# Patient Record
Sex: Female | Born: 1990
Health system: Southern US, Community
[De-identification: ages and names within clinical notes are randomized; demographics above are authoritative.]

## PROBLEM LIST (undated history)

## (undated) DIAGNOSIS — Z789 Other specified health status: Secondary | ICD-10-CM

## (undated) HISTORY — PX: NO PAST SURGERIES: SHX2092

---

## 2015-02-22 NOTE — L&D Delivery Note (Addendum)
Delivery Note Progressed to complete dilation.   Pushed for only a short while to crowning.   At 3:47 AM a viable and healthy female was delivered via Vaginal, Spontaneous Delivery (Presentation: OA ).  APGAR: , ; weight  .   Placenta status: spontaneous and grossly intact with 3VC with the following complications: none  Anesthesia:  Fentanyl, then Local with repair Episiotomy:  none Lacerations:  Shallow 2nd degree perineal Suture Repair: 3.0 monocryl Est. Blood Loss (mL):    Mom to postpartum.  Baby to Couplet care / Skin to Skin.  [facility] IncWILLIAMS,Erica Dinapoli 01/05/2016, 4:17 AM

## 2015-05-14 ENCOUNTER — Encounter (HOSPITAL_COMMUNITY): Payer: Self-pay | Admitting: Emergency Medicine

## 2015-05-14 ENCOUNTER — Emergency Department (HOSPITAL_COMMUNITY): Payer: BLUE CROSS/BLUE SHIELD

## 2015-05-14 DIAGNOSIS — R062 Wheezing: Secondary | ICD-10-CM | POA: Diagnosis not present

## 2015-05-14 DIAGNOSIS — R000 Tachycardia, unspecified: Secondary | ICD-10-CM | POA: Insufficient documentation

## 2015-05-14 DIAGNOSIS — R05 Cough: Secondary | ICD-10-CM | POA: Diagnosis not present

## 2015-05-14 DIAGNOSIS — R0989 Other specified symptoms and signs involving the circulatory and respiratory systems: Secondary | ICD-10-CM | POA: Insufficient documentation

## 2015-05-14 DIAGNOSIS — R0789 Other chest pain: Secondary | ICD-10-CM | POA: Diagnosis not present

## 2015-05-14 LAB — CBC WITH DIFFERENTIAL/PLATELET
BASOS ABS: 0 10*3/uL (ref 0.0–0.1)
Basophils Relative: 0 %
EOS PCT: 2 %
Eosinophils Absolute: 0.3 10*3/uL (ref 0.0–0.7)
HEMATOCRIT: 39.6 % (ref 36.0–46.0)
Hemoglobin: 12.7 g/dL (ref 12.0–15.0)
LYMPHS PCT: 8 %
Lymphs Abs: 1.3 10*3/uL (ref 0.7–4.0)
MCH: 28.7 pg (ref 26.0–34.0)
MCHC: 32.1 g/dL (ref 30.0–36.0)
MCV: 89.6 fL (ref 78.0–100.0)
MONO ABS: 1.3 10*3/uL — AB (ref 0.1–1.0)
MONOS PCT: 8 %
NEUTROS ABS: 13.2 10*3/uL — AB (ref 1.7–7.7)
Neutrophils Relative %: 82 %
PLATELETS: 285 10*3/uL (ref 150–400)
RBC: 4.42 MIL/uL (ref 3.87–5.11)
RDW: 13.1 % (ref 11.5–15.5)
WBC: 16 10*3/uL — ABNORMAL HIGH (ref 4.0–10.5)

## 2015-05-14 LAB — BASIC METABOLIC PANEL
ANION GAP: 11 (ref 5–15)
CALCIUM: 9.3 mg/dL (ref 8.9–10.3)
CO2: 20 mmol/L — AB (ref 22–32)
CREATININE: 0.82 mg/dL (ref 0.44–1.00)
Chloride: 106 mmol/L (ref 101–111)
GFR calc Af Amer: >60 mL/min (ref >60)
GLUCOSE: 89 mg/dL (ref 65–99)
Potassium: 3.5 mmol/L (ref 3.5–5.1)
Sodium: 137 mmol/L (ref 135–145)

## 2015-05-14 MED ORDER — ALBUTEROL SULFATE (2.5 MG/3ML) 0.083% IN NEBU
5.0000 mg | INHALATION_SOLUTION | Freq: Once | RESPIRATORY_TRACT | Status: AC
  Administered 2015-05-14: 5 mg via RESPIRATORY_TRACT

## 2015-05-14 MED ORDER — ALBUTEROL SULFATE (2.5 MG/3ML) 0.083% IN NEBU
INHALATION_SOLUTION | RESPIRATORY_TRACT | Status: AC
  Filled 2015-05-14: qty 6

## 2015-05-14 NOTE — ED Notes (Signed)
Pt. reports worsening SOB with wheezing , chest congestion/tightness , runny nose and dry cough onset today , tachycardic at triage HR= 136 .

## 2015-05-14 NOTE — ED Notes (Signed)
EDP ( Dr. Ranae PalmsYelverton ) , charge nurse Alvy Beal( E. Brown ) , nurse first ( E. Fields ) notified on pt.'s elevated heart rate , no verbal order received from EDP , will move to a room as soon as possible .

## 2015-05-15 ENCOUNTER — Emergency Department (HOSPITAL_COMMUNITY)
Admission: EM | Admit: 2015-05-15 | Discharge: 2015-05-15 | Disposition: A | Discharge status: Home / Self Care | Payer: BLUE CROSS/BLUE SHIELD | Attending: Emergency Medicine | Admitting: Emergency Medicine

## 2015-05-28 ENCOUNTER — Encounter: Payer: Self-pay | Admitting: Certified Nurse Midwife

## 2015-05-28 ENCOUNTER — Ambulatory Visit (INDEPENDENT_AMBULATORY_CARE_PROVIDER_SITE_OTHER): Payer: BLUE CROSS/BLUE SHIELD | Admitting: Certified Nurse Midwife

## 2015-05-28 VITALS — BP 127/75 | Temp 99.7°F | Wt 150.0 lb

## 2015-05-28 DIAGNOSIS — Z3401 Encounter for supervision of normal first pregnancy, first trimester: Secondary | ICD-10-CM | POA: Diagnosis not present

## 2015-05-28 DIAGNOSIS — Z3490 Encounter for supervision of normal pregnancy, unspecified, unspecified trimester: Secondary | ICD-10-CM | POA: Insufficient documentation

## 2015-05-28 LAB — POCT URINALYSIS DIPSTICK
Bilirubin, UA: NEGATIVE
Blood, UA: NEGATIVE
GLUCOSE UA: NEGATIVE
Nitrite, UA: NEGATIVE
SPEC GRAV UA: 1.01
UROBILINOGEN UA: NEGATIVE
pH, UA: 8

## 2015-05-28 MED ORDER — CITRANATAL HARMONY 29-1-265 MG PO CAPS: 1.0000 | ORAL_CAPSULE | Freq: Every day | ORAL | Status: DC

## 2015-05-28 NOTE — Progress Notes (Signed)
Subjective:    Erica Pham is being seen today for her first obstetrical visit.  This is not a planned pregnancy. She is at [redacted]w[redacted]d gestation. Her obstetrical history is significant for none. Relationship with FOB: significant other, living together. Patient does intend to breast feed. Pregnancy history fully reviewed.  The information documented in the HPI was reviewed and verified.  Menstrual History: OB History    Gravida Para Term Preterm AB TAB SAB Ectopic Multiple Living   1               Menarche age: 25 years of age  Patient's last menstrual period was 04/06/2015.    History reviewed. No pertinent past medical history.  History reviewed. No pertinent past surgical history.   (Not in a hospital admission) Allergies  Allergen Reactions  . Prednisone Shortness Of Breath    Social History  Substance Use Topics  . Smoking status: Never Smoker   . Smokeless tobacco: Not on file  . Alcohol Use: No     Comment: not currently    Family History  Problem Relation Age of Onset  . Diabetes Maternal Grandfather      Review of Systems Constitutional: negative for weight loss Gastrointestinal: negative for vomiting, + nausea Genitourinary:negative for genital lesions and vaginal discharge and dysuria Musculoskeletal:negative for back pain Behavioral/Psych: negative for abusive relationship, depression, illegal drug usage and tobacco use    Objective:    BP 127/75 mmHg  Temp(Src) 99.7 F (37.6 C)  Wt 150 lb (68.04 kg)  LMP 04/06/2015 General Appearance:    Alert, cooperative, no distress, appears stated age  Head:    Normocephalic, without obvious abnormality, atraumatic  Eyes:    PERRL, conjunctiva/corneas clear, EOM's intact, fundi    benign, both eyes  Ears:    Normal TM's and external ear canals, both ears  Nose:   Nares normal, septum midline, mucosa normal, no drainage    or sinus tenderness  Throat:   Lips, mucosa, and tongue normal; teeth and gums normal   Neck:   Supple, symmetrical, trachea midline, no adenopathy;    thyroid:  no enlargement/tenderness/nodules; no carotid   bruit or JVD  Back:     Symmetric, no curvature, ROM normal, no CVA tenderness  Lungs:     Clear to auscultation bilaterally, respirations unlabored  Chest Wall:    No tenderness or deformity   Heart:    Regular rate and rhythm, S1 and S2 normal, no murmur, rub   or gallop  Breast Exam:    No tenderness, masses, or nipple abnormality  Abdomen:     Soft, non-tender, bowel sounds active all four quadrants,    no masses, no organomegaly  Genitalia:    Normal female without lesion, discharge or tenderness  Extremities:   Extremities normal, atraumatic, no cyanosis or edema  Pulses:   2+ and symmetric all extremities  Skin:   Skin color, texture, turgor normal, no rashes or lesions  Lymph nodes:   Cervical, supraclavicular, and axillary nodes normal  Neurologic:   CNII-XII intact, normal strength, sensation and reflexes    throughout          Cervix:  Long, thick, closed and posterior2    Lab Review Urine pregnancy test Labs reviewed yes Radiologic studies reviewed no Assessment:    Pregnancy at [redacted]w[redacted]d weeks   Nausea in early pregnancy  Plan:     Prenatal vitamins samples given, rx written if too expensive consider OTC PNV.  Counseling provided  regarding continued use of seat belts, cessation of alcohol consumption, smoking or use of illicit drugs; infection precautions i.e., influenza/TDAP immunizations, toxoplasmosis,CMV, parvovirus, listeria and varicella; workplace safety, exercise during pregnancy; routine dental care, safe medications, sexual activity, hot tubs, saunas, pools, travel, caffeine use, fish and methlymercury, potential toxins, hair treatments, varicose veins Weight gain recommendations per IOM guidelines reviewed: underweight/BMI< 18.5--> gain 28 - 40 lbs; normal weight/BMI 18.5 - 24.9--> gain 25 - 35 lbs; overweight/BMI 25 - 29.9--> gain 15 - 25  lbs; obese/BMI >30->gain  11 - 20 lbs Problem list reviewed and updated. FIRST/CF mutation testing/NIPT/QUAD SCREEN/fragile X/Ashkenazi Jewish population testing/Spinal muscular atrophy discussed: requested. Role of ultrasound in pregnancy discussed; fetal survey: requested. Amniocentesis discussed: not indicated. VBAC calculator score: VBAC consent form provided Meds ordered this encounter  Medications  . prenatal vitamin w/FE, FA (PRENATAL 1 + 1) 27-1 MG TABS tablet    Sig: Take 1 tablet by mouth daily at 12 noon.   Orders Placed This Encounter  Procedures  . Culture, OB Urine  . HIV antibody  . Hemoglobinopathy evaluation  . Varicella zoster antibody, IgG  . Prenatal Profile I  . VITAMIN D 25 Hydroxy (Vit-D Deficiency, Fractures)  . POCT urinalysis dipstick    Follow up in 4 weeks. 50% of 30 min visit spent on counseling and coordination of care.

## 2015-05-30 LAB — URINE CULTURE, OB REFLEX

## 2015-05-30 LAB — CULTURE, OB URINE

## 2015-06-01 ENCOUNTER — Other Ambulatory Visit: Payer: Self-pay | Admitting: Certified Nurse Midwife

## 2015-06-01 DIAGNOSIS — B3731 Acute candidiasis of vulva and vagina: Secondary | ICD-10-CM

## 2015-06-01 DIAGNOSIS — B373 Candidiasis of vulva and vagina: Secondary | ICD-10-CM

## 2015-06-01 LAB — PRENATAL PROFILE I(LABCORP)
Antibody Screen: NEGATIVE
BASOS ABS: 0 10*3/uL (ref 0.0–0.2)
Basos: 0 %
EOS (ABSOLUTE): 0 10*3/uL (ref 0.0–0.4)
Eos: 0 %
HEMOGLOBIN: 13 g/dL (ref 11.1–15.9)
Hematocrit: 40.4 % (ref 34.0–46.6)
Hepatitis B Surface Ag: NEGATIVE
Immature Grans (Abs): 0 10*3/uL (ref 0.0–0.1)
Immature Granulocytes: 0 %
LYMPHS: 18 %
Lymphocytes Absolute: 1.4 10*3/uL (ref 0.7–3.1)
MCH: 29.3 pg (ref 26.6–33.0)
MCHC: 32.2 g/dL (ref 31.5–35.7)
MCV: 91 fL (ref 79–97)
MONOCYTES: 9 %
Monocytes Absolute: 0.7 10*3/uL (ref 0.1–0.9)
NEUTROS ABS: 5.6 10*3/uL (ref 1.4–7.0)
Neutrophils: 73 %
PLATELETS: 334 10*3/uL (ref 150–379)
RBC: 4.44 x10E6/uL (ref 3.77–5.28)
RDW: 13.5 % (ref 12.3–15.4)
RPR Ser Ql: NONREACTIVE
Rh Factor: POSITIVE
Rubella Antibodies, IGG: 2.25 index (ref >0.99)
WBC: 7.8 10*3/uL (ref 3.4–10.8)

## 2015-06-01 LAB — PAP IG W/ RFLX HPV ASCU: PAP SMEAR COMMENT: 0

## 2015-06-01 LAB — NUSWAB VAGINITIS PLUS (VG+)
CANDIDA ALBICANS, NAA: POSITIVE — AB
CHLAMYDIA TRACHOMATIS, NAA: NEGATIVE
Candida glabrata, NAA: NEGATIVE
Neisseria gonorrhoeae, NAA: NEGATIVE
Trich vag by NAA: NEGATIVE

## 2015-06-01 LAB — HEMOGLOBINOPATHY EVALUATION
HGB C: 0 %
HGB S: 0 %
Hemoglobin A2 Quantitation: 2.5 % (ref 0.7–3.1)
Hemoglobin F Quantitation: 0 % (ref 0.0–2.0)
Hgb A: 97.5 % (ref 94.0–98.0)

## 2015-06-01 LAB — VITAMIN D 25 HYDROXY (VIT D DEFICIENCY, FRACTURES): VIT D 25 HYDROXY: 11.3 ng/mL — AB (ref 30.0–100.0)

## 2015-06-01 LAB — HIV ANTIBODY (ROUTINE TESTING W REFLEX): HIV Screen 4th Generation wRfx: NONREACTIVE

## 2015-06-01 LAB — VARICELLA ZOSTER ANTIBODY, IGG: Varicella zoster IgG: 2403 index (ref >165)

## 2015-06-01 MED ORDER — TERCONAZOLE 0.4 % VA CREA: 1.0000 | TOPICAL_CREAM | Freq: Every day | VAGINAL | Status: DC

## 2015-06-02 ENCOUNTER — Encounter: Payer: Self-pay | Admitting: *Deleted

## 2015-06-25 ENCOUNTER — Ambulatory Visit (INDEPENDENT_AMBULATORY_CARE_PROVIDER_SITE_OTHER): Payer: BLUE CROSS/BLUE SHIELD | Admitting: Certified Nurse Midwife

## 2015-06-25 ENCOUNTER — Other Ambulatory Visit: Payer: Self-pay | Admitting: Certified Nurse Midwife

## 2015-06-25 VITALS — BP 127/71 | HR 105 | Wt 154.0 lb

## 2015-06-25 DIAGNOSIS — Z3401 Encounter for supervision of normal first pregnancy, first trimester: Secondary | ICD-10-CM

## 2015-06-25 LAB — POCT URINALYSIS DIPSTICK
BILIRUBIN UA: NEGATIVE
Blood, UA: NEGATIVE
GLUCOSE UA: NEGATIVE
KETONES UA: NEGATIVE
Leukocytes, UA: NEGATIVE
Nitrite, UA: NEGATIVE
Protein, UA: NEGATIVE
SPEC GRAV UA: 1.01
UROBILINOGEN UA: NEGATIVE
pH, UA: 7

## 2015-06-25 NOTE — Progress Notes (Signed)
  Subjective:    Erica Pham is a 25 y.o. female being seen today for her obstetrical visit. She is at 10131w3d gestation. Patient reports: no complaints.  Problem List Items Addressed This Visit    None     Patient Active Problem List   Diagnosis Date Noted  . Supervision of normal first pregnancy in first trimester 05/28/2015    Objective:     BP 127/71 mmHg  Pulse 105  Wt 154 lb (69.854 kg)  LMP 04/06/2015 Uterine Size: Below umbilicus   FHR: 155  Assessment:    Pregnancy @ 6531w3d  weeks Doing well    Plan:    Problem list reviewed and updated. Labs reviewed.  Follow up in 4 weeks. FIRST/CF mutation testing/NIPT/QUAD SCREEN/fragile X/Ashkenazi Jewish population testing/Spinal muscular atrophy discussed: ordered. Role of ultrasound in pregnancy discussed; fetal survey: requested. Amniocentesis discussed: not indicated. 50% of 15 minute visit spent on counseling and coordination of care.

## 2015-06-25 NOTE — Addendum Note (Signed)
Addended by: Luella CookMCINTYRE, DIRECE E on: 06/25/2015 04:31 PM   Modules accepted: Orders

## 2015-06-25 NOTE — Progress Notes (Signed)
Patient has no questions or concerns today.

## 2015-07-08 LAB — MATERNIT21 PLUS CORE+SCA
CHROMOSOME 13: NEGATIVE
CHROMOSOME 18: NEGATIVE
CHROMOSOME 21: NEGATIVE
PDF: 0
Y CHROMOSOME: DETECTED

## 2015-07-09 ENCOUNTER — Encounter: Payer: Self-pay | Admitting: Certified Nurse Midwife

## 2015-07-09 NOTE — Telephone Encounter (Signed)
Spoke with pt regarding results for Materni21. Pt made aware results are negative.  Pt request copy to be printed for her to pick up.  Pt would like to know gender when partner is available. Pt made aware copy will be left at front desk for pick up.

## 2015-07-14 ENCOUNTER — Other Ambulatory Visit: Payer: Self-pay | Admitting: Certified Nurse Midwife

## 2015-07-23 ENCOUNTER — Ambulatory Visit (INDEPENDENT_AMBULATORY_CARE_PROVIDER_SITE_OTHER): Payer: BLUE CROSS/BLUE SHIELD | Admitting: Certified Nurse Midwife

## 2015-07-23 VITALS — BP 138/81 | HR 109 | Wt 157.0 lb

## 2015-07-23 DIAGNOSIS — Z3402 Encounter for supervision of normal first pregnancy, second trimester: Secondary | ICD-10-CM

## 2015-07-23 LAB — POCT URINALYSIS DIPSTICK
Bilirubin, UA: NEGATIVE
Glucose, UA: NEGATIVE
KETONES UA: NEGATIVE
LEUKOCYTES UA: NEGATIVE
NITRITE UA: NEGATIVE
PH UA: 6
RBC UA: NEGATIVE
Spec Grav, UA: 1.025
Urobilinogen, UA: NEGATIVE

## 2015-07-23 NOTE — Progress Notes (Signed)
  Subjective:    Erica Pham is a 25 y.o. female being seen today for her obstetrical visit. She is at 882w3d gestation. Patient reports: no complaints.  Problem List Items Addressed This Visit    None    Visit Diagnoses    Encounter for supervision of normal first pregnancy in second trimester    -  Primary    Relevant Orders    POCT urinalysis dipstick (Completed)    US OB Comp + 14 Wk      Patient Active Problem List   Diagnosis Date Noted  . Supervision of normal first pregnancy in first trimester 05/28/2015    Objective:     BP 138/81 mmHg  Pulse 109  Wt 157 lb (71.215 kg)  LMP 04/06/2015 Uterine Size: Below umbilicus   FHR: 145 by doppler  Assessment:    Pregnancy @ 4182w3d  weeks Doing well    Plan:    Problem list reviewed and updated. Labs reviewed.  Follow up in 4 weeks. FIRST/CF mutation testing/NIPT/QUAD SCREEN/fragile X/Ashkenazi Jewish population testing/Spinal muscular atrophy discussed: results reviewed. Role of ultrasound in pregnancy discussed; fetal survey: ordered. Amniocentesis discussed: not indicated. 50% of 15 minute visit spent on counseling and coordination of care.

## 2015-08-20 ENCOUNTER — Ambulatory Visit (INDEPENDENT_AMBULATORY_CARE_PROVIDER_SITE_OTHER): Payer: BLUE CROSS/BLUE SHIELD

## 2015-08-20 ENCOUNTER — Ambulatory Visit (INDEPENDENT_AMBULATORY_CARE_PROVIDER_SITE_OTHER): Payer: BLUE CROSS/BLUE SHIELD | Admitting: Certified Nurse Midwife

## 2015-08-20 ENCOUNTER — Encounter: Payer: Self-pay | Admitting: *Deleted

## 2015-08-20 VITALS — BP 127/78 | HR 95 | Wt 162.0 lb

## 2015-08-20 DIAGNOSIS — Z36 Encounter for antenatal screening of mother: Secondary | ICD-10-CM

## 2015-08-20 DIAGNOSIS — Z3492 Encounter for supervision of normal pregnancy, unspecified, second trimester: Secondary | ICD-10-CM

## 2015-08-20 DIAGNOSIS — Z3402 Encounter for supervision of normal first pregnancy, second trimester: Secondary | ICD-10-CM

## 2015-08-20 LAB — POCT URINALYSIS DIPSTICK
Bilirubin, UA: NEGATIVE
Glucose, UA: NEGATIVE
Ketones, UA: NEGATIVE
NITRITE UA: NEGATIVE
PH UA: 7
RBC UA: NEGATIVE
Spec Grav, UA: 1.01
UROBILINOGEN UA: NEGATIVE

## 2015-08-20 NOTE — Progress Notes (Signed)
Subjective:    Erica Pham is a 25 y.o. female being seen today for her obstetrical visit. She is at 5659w3d gestation. Patient reports: no complaints . Fetal movement: normal.  Problem List Items Addressed This Visit    None    Visit Diagnoses    Prenatal care, second trimester    -  Primary    Relevant Orders    POCT urinalysis dipstick (Completed)      Patient Active Problem List   Diagnosis Date Noted  . Supervision of normal first pregnancy in first trimester 05/28/2015   Objective:    BP 127/78 mmHg  Pulse 95  Wt 162 lb (73.483 kg)  LMP 04/06/2015 FHT: 150 BPM  Uterine Size: size equals dates     Assessment:    Pregnancy @ 4859w3d    Plan:    OBGCT: discussed. Signs and symptoms of preterm labor: discussed. Waterbirth certificate recieved  Labs, problem list reviewed and updated 2 hr GTT planned Follow up in 4 weeks.

## 2015-08-22 ENCOUNTER — Encounter: Payer: Self-pay | Admitting: Certified Nurse Midwife

## 2015-09-04 ENCOUNTER — Other Ambulatory Visit: Payer: Self-pay | Admitting: Certified Nurse Midwife

## 2015-09-18 ENCOUNTER — Ambulatory Visit (INDEPENDENT_AMBULATORY_CARE_PROVIDER_SITE_OTHER): Payer: BLUE CROSS/BLUE SHIELD | Admitting: Certified Nurse Midwife

## 2015-09-18 VITALS — BP 117/73 | HR 96 | Temp 98.8°F | Wt 173.0 lb

## 2015-09-18 DIAGNOSIS — J301 Allergic rhinitis due to pollen: Secondary | ICD-10-CM

## 2015-09-18 DIAGNOSIS — Z3402 Encounter for supervision of normal first pregnancy, second trimester: Secondary | ICD-10-CM

## 2015-09-18 LAB — POCT URINALYSIS DIPSTICK
Bilirubin, UA: NEGATIVE
Blood, UA: NEGATIVE
Glucose, UA: NEGATIVE
Ketones, UA: NEGATIVE
NITRITE UA: NEGATIVE
PROTEIN UA: NEGATIVE
UROBILINOGEN UA: NEGATIVE
pH, UA: 7

## 2015-09-18 MED ORDER — FLUTICASONE PROPIONATE 50 MCG/ACT NA SUSP: 2.0000 | Freq: Every day | NASAL | 2 refills | Status: DC

## 2015-09-18 MED ORDER — LORATADINE 10 MG PO TABS: 10.0000 mg | ORAL_TABLET | Freq: Every day | ORAL | 99 refills | Status: DC

## 2015-09-18 NOTE — Progress Notes (Signed)
Pt is having cold/allergy symptoms. Please review for advise on medication.

## 2015-09-18 NOTE — Progress Notes (Signed)
Subjective:    Erica Pham is a 25 y.o. female being seen today for her obstetrical visit. She is at 109w4d gestation. Patient reports: no bleeding, no contractions, no cramping, no leaking and sinus pressure, congestion with hx of allergies; has not been taking anything for them . Denies any fever, chills or sputum production/cough. Fetal movement: normal.  Problem List Items Addressed This Visit    None    Visit Diagnoses    Encounter for supervision of normal first pregnancy in second trimester    -  Primary   Relevant Orders   POCT urinalysis dipstick (Completed)   Allergic rhinitis due to pollen       Relevant Medications   loratadine (CLARITIN) 10 MG tablet   fluticasone (FLONASE) 50 MCG/ACT nasal spray     Patient Active Problem List   Diagnosis Date Noted  . Supervision of normal first pregnancy in first trimester 05/28/2015   Objective:    BP 117/73   Pulse 96   Temp 98.8 F (37.1 C)   Wt 173 lb (78.5 kg)   LMP 04/06/2015   BMI 29.70 kg/m  FHT: 135 BPM  Uterine Size: size equals dates   + maxillary sinus pressure/tenderness to palpation.    Assessment:    Pregnancy @ [redacted]w[redacted]d    Allergic rhinitis  Plan:    Safe OTC medications discussed   OBGCT: discussed and ordered for next visit. Signs and symptoms of preterm labor: discussed.  Labs, problem list reviewed and updated 2 hr GTT planned Follow up in 4 weeks.

## 2015-10-16 ENCOUNTER — Other Ambulatory Visit: Payer: BLUE CROSS/BLUE SHIELD

## 2015-10-16 ENCOUNTER — Ambulatory Visit (INDEPENDENT_AMBULATORY_CARE_PROVIDER_SITE_OTHER): Payer: BLUE CROSS/BLUE SHIELD | Admitting: Certified Nurse Midwife

## 2015-10-16 VITALS — BP 106/66 | HR 95 | Temp 98.5°F | Wt 181.9 lb

## 2015-10-16 DIAGNOSIS — Z3401 Encounter for supervision of normal first pregnancy, first trimester: Secondary | ICD-10-CM

## 2015-10-16 DIAGNOSIS — Z3402 Encounter for supervision of normal first pregnancy, second trimester: Secondary | ICD-10-CM

## 2015-10-16 LAB — POCT URINALYSIS DIPSTICK
Bilirubin, UA: NEGATIVE
Glucose, UA: NEGATIVE
KETONES UA: NEGATIVE
Nitrite, UA: NEGATIVE
PH UA: 8
PROTEIN UA: NEGATIVE
RBC UA: NEGATIVE
SPEC GRAV UA: 1.015
Urobilinogen, UA: 0.2

## 2015-10-16 NOTE — Progress Notes (Signed)
Patient denies concerns at this time.

## 2015-10-16 NOTE — Progress Notes (Signed)
Subjective:    Erica Pham is a 25 y.o. female being seen today for her obstetrical visit. She is at 1763w4d gestation. Patient reports: no complaints . Fetal movement: normal.  Problem List Items Addressed This Visit      Other   Supervision of normal first pregnancy in first trimester    Other Visit Diagnoses    Encounter for supervision of normal first pregnancy in second trimester    -  Primary   Relevant Orders   POCT Urinalysis Dipstick (Completed)     Patient Active Problem List   Diagnosis Date Noted  . Supervision of normal first pregnancy in first trimester 05/28/2015   Objective:    BP 106/66   Pulse 95   Temp 98.5 F (36.9 C)   Wt 181 lb 14.4 oz (82.5 kg)   LMP 04/06/2015   BMI 31.22 kg/m  FHT: 140 BPM  Uterine Size: 27 cm and size equals dates     Assessment:    Pregnancy @ 9763w4d    Planning waterbirth   Plan:    OBGCT: discussed and ordered. Signs and symptoms of preterm labor: discussed.    Labs, problem list reviewed and updated 2 hr GTT today Follow up in 2 weeks.

## 2015-10-16 NOTE — Addendum Note (Signed)
Addended by: Marya LandryFOSTER, Vonya Ohalloran D on: 10/16/2015 11:32 AM   Modules accepted: Orders

## 2015-10-17 LAB — CBC
Hematocrit: 36.2 % (ref 34.0–46.6)
Hemoglobin: 12 g/dL (ref 11.1–15.9)
MCH: 30.7 pg (ref 26.6–33.0)
MCHC: 33.1 g/dL (ref 31.5–35.7)
MCV: 93 fL (ref 79–97)
PLATELETS: 212 10*3/uL (ref 150–379)
RBC: 3.91 x10E6/uL (ref 3.77–5.28)
RDW: 14.1 % (ref 12.3–15.4)
WBC: 10.5 10*3/uL (ref 3.4–10.8)

## 2015-10-17 LAB — GLUCOSE TOLERANCE, 2 HOURS W/ 1HR
GLUCOSE, FASTING: 77 mg/dL (ref 65–91)
Glucose, 1 hour: 100 mg/dL (ref 65–179)
Glucose, 2 hour: 94 mg/dL (ref 65–152)

## 2015-10-17 LAB — RPR: RPR Ser Ql: NONREACTIVE

## 2015-10-17 LAB — HIV ANTIBODY (ROUTINE TESTING W REFLEX): HIV Screen 4th Generation wRfx: NONREACTIVE

## 2015-10-18 ENCOUNTER — Other Ambulatory Visit: Payer: Self-pay | Admitting: Certified Nurse Midwife

## 2015-10-18 DIAGNOSIS — Z3401 Encounter for supervision of normal first pregnancy, first trimester: Secondary | ICD-10-CM

## 2015-10-21 ENCOUNTER — Telehealth: Payer: Self-pay | Admitting: *Deleted

## 2015-10-21 NOTE — Telephone Encounter (Signed)
Patient made aware of 2 hour GTT result.

## 2015-10-30 ENCOUNTER — Encounter: Payer: Self-pay | Admitting: Certified Nurse Midwife

## 2015-10-30 ENCOUNTER — Ambulatory Visit (INDEPENDENT_AMBULATORY_CARE_PROVIDER_SITE_OTHER): Payer: BLUE CROSS/BLUE SHIELD | Admitting: Certified Nurse Midwife

## 2015-10-30 VITALS — BP 126/75 | HR 96 | Temp 99.1°F | Wt 184.4 lb

## 2015-10-30 DIAGNOSIS — Z3403 Encounter for supervision of normal first pregnancy, third trimester: Secondary | ICD-10-CM

## 2015-10-30 NOTE — Progress Notes (Signed)
Subjective:    Erica Pham is a 25 y.o. female being seen today for her obstetrical visit. She is at 8734w4d gestation. Patient reports no complaints. Fetal movement: normal.  Problem List Items Addressed This Visit    None    Visit Diagnoses    Supervision of normal first pregnancy in third trimester    -  Primary     Patient Active Problem List   Diagnosis Date Noted  . Supervision of normal first pregnancy in first trimester 05/28/2015   Objective:    BP 126/75   Pulse 96   Temp 99.1 F (37.3 C)   Wt 184 lb 6.4 oz (83.6 kg)   LMP 04/06/2015   BMI 31.65 kg/m  FHT:  130 BPM  Uterine Size: 31 cm and size equals dates  Presentation: cephalic     Assessment:    Pregnancy @ 1634w4d weeks   Doing well  Waterbirth planned  Plan:     labs reviewed, problem list updated Consent signed. GBS planning TDAP offered  Rhogam given for RH negative Pediatrician: discussed. Infant feeding: plans to breastfeed. Maternity leave: discussed. Cigarette smoking: never smoked. No orders of the defined types were placed in this encounter.  No orders of the defined types were placed in this encounter.  Follow up in 2 Weeks.

## 2015-10-30 NOTE — Progress Notes (Signed)
Pt denies concerns at this time. 

## 2015-11-10 ENCOUNTER — Telehealth: Payer: Self-pay | Admitting: *Deleted

## 2015-11-10 NOTE — Telephone Encounter (Signed)
Patient is having sinus pressure for over 1 week and she needs to talk to someone. LM on VM to CB

## 2015-11-17 NOTE — Telephone Encounter (Signed)
No response- re file 

## 2015-11-18 ENCOUNTER — Ambulatory Visit (INDEPENDENT_AMBULATORY_CARE_PROVIDER_SITE_OTHER): Payer: BLUE CROSS/BLUE SHIELD | Admitting: Obstetrics and Gynecology

## 2015-11-18 VITALS — BP 109/71 | HR 98 | Temp 99.3°F | Wt 190.4 lb

## 2015-11-18 DIAGNOSIS — Z3401 Encounter for supervision of normal first pregnancy, first trimester: Secondary | ICD-10-CM

## 2015-11-18 DIAGNOSIS — Z3493 Encounter for supervision of normal pregnancy, unspecified, third trimester: Secondary | ICD-10-CM | POA: Diagnosis not present

## 2015-11-18 MED ORDER — TETANUS-DIPHTH-ACELL PERTUSSIS 5-2.5-18.5 LF-MCG/0.5 IM SUSP
0.5000 mL | Freq: Once | INTRAMUSCULAR | Status: AC
  Administered 2015-11-18: 0.5 mL via INTRAMUSCULAR

## 2015-11-18 NOTE — Progress Notes (Signed)
   PRENATAL VISIT NOTE  Subjective:  Erica Pham is a 25 y.o. G1P0 at 6951w2d being seen today for ongoing prenatal care.  She is currently monitored for the following issues for this low-risk pregnancy and has Supervision of normal first pregnancy in first trimester on her problem list.  Patient reports no complaints.  Contractions: Not present. Vag. Bleeding: None.  Movement: Present. Denies leaking of fluid.   The following portions of the patient's history were reviewed and updated as appropriate: allergies, current medications, past family history, past medical history, past social history, past surgical history and problem list. Problem list updated.  Objective:   Vitals:   11/18/15 0810  BP: 109/71  Pulse: 98  Temp: 99.3 F (37.4 C)  Weight: 190 lb 6.4 oz (86.4 kg)    Fetal Status: Fetal Heart Rate (bpm): 139 Fundal Height: 32 cm Movement: Present     General:  Alert, oriented and cooperative. Patient is in no acute distress.  Skin: Skin is warm and dry. No rash noted.   Cardiovascular: Normal heart rate noted  Respiratory: Normal respiratory effort, no problems with respiration noted  Abdomen: Soft, gravid, appropriate for gestational age. Pain/Pressure: Absent     Pelvic:  Cervical exam deferred        Extremities: Normal range of motion.  Edema: None  Mental Status: Normal mood and affect. Normal behavior. Normal judgment and thought content.   Urinalysis: Urine Protein: Negative Urine Glucose: Negative  Assessment and Plan:  Pregnancy: G1P0 at 7751w2d  1. Supervision of normal first pregnancy in first trimester Patient is doing well without complaints Still planning water birth and has her supplies Declined flu vaccine Desires TDap vaccine - Tdap (BOOSTRIX) injection 0.5 mL; Inject 0.5 mLs into the muscle once.  Preterm labor symptoms and general obstetric precautions including but not limited to vaginal bleeding, contractions, leaking of fluid and fetal  movement were reviewed in detail with the patient. Please refer to After Visit Summary for other counseling recommendations.  No Follow-up on file.  Catalina AntiguaPeggy Jolly Carlini, MD

## 2015-11-18 NOTE — Progress Notes (Signed)
Patient is in the office for visit, pt states that she is overall feeling well. Administered Tdap and patient tolerated

## 2015-11-24 ENCOUNTER — Ambulatory Visit (INDEPENDENT_AMBULATORY_CARE_PROVIDER_SITE_OTHER): Payer: BLUE CROSS/BLUE SHIELD | Admitting: Obstetrics

## 2015-11-24 VITALS — BP 138/78 | HR 108 | Temp 97.9°F | Wt 195.3 lb

## 2015-11-24 DIAGNOSIS — J0111 Acute recurrent frontal sinusitis: Secondary | ICD-10-CM

## 2015-11-24 DIAGNOSIS — Z3403 Encounter for supervision of normal first pregnancy, third trimester: Secondary | ICD-10-CM

## 2015-11-24 MED ORDER — AZITHROMYCIN 250 MG PO TABS: ORAL_TABLET | ORAL | 0 refills | Status: DC

## 2015-11-30 ENCOUNTER — Encounter: Payer: Self-pay | Admitting: Obstetrics

## 2015-11-30 NOTE — Progress Notes (Signed)
Subjective:    Erica Pham is a 25 y.o. female being seen today for her obstetrical visit. She is at 7615w0d gestation. Patient reports no complaints. Fetal movement: normal.  Problem List Items Addressed This Visit    None    Visit Diagnoses    Supervision of normal first pregnancy in third trimester    -  Primary   Acute recurrent frontal sinusitis       Relevant Medications   azithromycin (ZITHROMAX) 250 MG tablet     Patient Active Problem List   Diagnosis Date Noted  . Supervision of normal first pregnancy in first trimester 05/28/2015   Objective:    BP 138/78   Pulse (!) 108   Temp 97.9 F (36.6 C)   Wt 195 lb 4.8 oz (88.6 kg)   LMP 04/06/2015   BMI 33.52 kg/m  FHT:  140 BPM  Uterine Size: size equals dates  Presentation: unsure     Assessment:    Pregnancy @ 7015w0d weeks   Plan:     labs reviewed, problem list updated Consent signed. GBS sent TDAP offered  Rhogam given for RH negative Pediatrician: discussed. Infant feeding: plans to breastfeed. Maternity leave: discussed. Cigarette smoking: never smoked. No orders of the defined types were placed in this encounter.  Meds ordered this encounter  Medications  . azithromycin (ZITHROMAX) 250 MG tablet    Sig: Take 2 tabs po load, then take 1 tab po daily.    Dispense:  15 tablet    Refill:  0   Follow up in 1 Week.

## 2015-12-02 ENCOUNTER — Encounter: Payer: BLUE CROSS/BLUE SHIELD | Admitting: Obstetrics and Gynecology

## 2015-12-08 ENCOUNTER — Ambulatory Visit (INDEPENDENT_AMBULATORY_CARE_PROVIDER_SITE_OTHER): Payer: BLUE CROSS/BLUE SHIELD | Admitting: Certified Nurse Midwife

## 2015-12-08 VITALS — BP 111/73 | HR 101 | Wt 195.0 lb

## 2015-12-08 DIAGNOSIS — N760 Acute vaginitis: Secondary | ICD-10-CM

## 2015-12-08 DIAGNOSIS — Z3403 Encounter for supervision of normal first pregnancy, third trimester: Secondary | ICD-10-CM

## 2015-12-08 DIAGNOSIS — Z113 Encounter for screening for infections with a predominantly sexual mode of transmission: Secondary | ICD-10-CM | POA: Diagnosis not present

## 2015-12-08 LAB — OB RESULTS CONSOLE GBS: GBS: NEGATIVE

## 2015-12-08 LAB — OB RESULTS CONSOLE GC/CHLAMYDIA: GC PROBE AMP, GENITAL: NEGATIVE

## 2015-12-08 NOTE — Progress Notes (Signed)
Subjective:    Erica Pham is a 25 y.o. female being seen today for her obstetrical visit. She is at 152w1d gestation. Patient reports no complaints. Fetal movement: normal.  Problem List Items Addressed This Visit    None    Visit Diagnoses    Supervision of normal first pregnancy in third trimester    -  Primary   Relevant Orders   Culture, beta strep (group b only)   Cervicovaginal ancillary only     Patient Active Problem List   Diagnosis Date Noted  . Supervision of normal first pregnancy in first trimester 05/28/2015   Objective:    BP 111/73   Pulse (!) 101   Wt 195 lb (88.5 kg)   LMP 04/06/2015   BMI 33.47 kg/m  FHT:  132 BPM  Uterine Size: 35 cm and size equals dates  Presentation: cephalic   Cervix:  1cm, long, ballotable, posterior, medium-hard.   Assessment:    Pregnancy @ 2152w1d weeks   Waterbirth planned  Plan:     labs reviewed, problem list updated Consent signed. GBS sent TDAP offered  Rhogam given for RH negative Pediatrician: discussed. Infant feeding: plans to breastfeed. Maternity leave: discussed. Cigarette smoking: never smoked. Orders Placed This Encounter  Procedures  . Culture, beta strep (group b only)   No orders of the defined types were placed in this encounter.  Follow up in 1 Week.

## 2015-12-09 LAB — CERVICOVAGINAL ANCILLARY ONLY
Chlamydia: POSITIVE — AB
Neisseria Gonorrhea: NEGATIVE
Trichomonas: NEGATIVE

## 2015-12-11 LAB — CERVICOVAGINAL ANCILLARY ONLY
BACTERIAL VAGINITIS: POSITIVE — AB
CANDIDA VAGINITIS: NEGATIVE

## 2015-12-12 LAB — CULTURE, BETA STREP (GROUP B ONLY): Strep Gp B Culture: NEGATIVE

## 2015-12-15 ENCOUNTER — Other Ambulatory Visit: Payer: Self-pay | Admitting: Certified Nurse Midwife

## 2015-12-15 ENCOUNTER — Ambulatory Visit (INDEPENDENT_AMBULATORY_CARE_PROVIDER_SITE_OTHER): Payer: BLUE CROSS/BLUE SHIELD | Admitting: Obstetrics & Gynecology

## 2015-12-15 ENCOUNTER — Telehealth: Payer: Self-pay

## 2015-12-15 DIAGNOSIS — A749 Chlamydial infection, unspecified: Secondary | ICD-10-CM | POA: Diagnosis not present

## 2015-12-15 DIAGNOSIS — O98813 Other maternal infectious and parasitic diseases complicating pregnancy, third trimester: Secondary | ICD-10-CM

## 2015-12-15 DIAGNOSIS — Z3493 Encounter for supervision of normal pregnancy, unspecified, third trimester: Secondary | ICD-10-CM

## 2015-12-15 DIAGNOSIS — O98313 Other infections with a predominantly sexual mode of transmission complicating pregnancy, third trimester: Secondary | ICD-10-CM | POA: Diagnosis not present

## 2015-12-15 DIAGNOSIS — O99820 Streptococcus B carrier state complicating pregnancy: Secondary | ICD-10-CM | POA: Insufficient documentation

## 2015-12-15 MED ORDER — AZITHROMYCIN 500 MG PO TABS: 1000.0000 mg | ORAL_TABLET | Freq: Once | ORAL | 1 refills | Status: AC

## 2015-12-15 NOTE — Telephone Encounter (Signed)
Patient in office today for visit and made aware of test results and told about rx. Patient seen Dr. Debroah LoopArnold today.

## 2015-12-15 NOTE — Patient Instructions (Signed)
Chlamydia, Female Chlamydia is an infection. It is spread through sexual contact. Chlamydia can be in different areas of the body. These areas include the cervix, urethra, throat, or rectum. You may not know you have chlamydia because many people never develop the symptoms. Chlamydia is not difficult to treat once you know you have it. However, if it is left untreated, chlamydia can lead to more serious health problems.  CAUSES  Chlamydia is caused by bacteria. It is a sexually transmitted disease. It is passed from an infected partner during intimate contact. This contact could be with the genitals, mouth, or rectal area. Chlamydia can also be passed from mothers to babies during birth. SIGNS AND SYMPTOMS  There may not be any symptoms. This is often the case early in the infection. If symptoms develop, they may include:  Mild pain and discomfort when urinating.  Redness, soreness, and swelling (inflammation) of the rectum.  Vaginal discharge.  Painful intercourse.  Abdominal pain.  Bleeding between menstrual periods. DIAGNOSIS  To diagnose this infection, your health care provider will do a pelvic exam. Cultures will be taken of the vagina, cervix, urine, and possibly the rectum to verify the diagnosis.  TREATMENT You will be given antibiotic medicines. If you are pregnant, certain types of antibiotics will need to be avoided. Any sexual partners should also be treated, even if they do not show symptoms. Your health care provider may test you for infection again 3 months after treatment. HOME CARE INSTRUCTIONS   Take your antibiotic medicine as directed by your health care provider. Finish the antibiotic even if you start to feel better.  Take medicines only as directed by your health care provider.  Inform any sexual partners about the infection. They should also be treated.  Do not have sexual contact until your health care provider tells you it is okay.  Get plenty of  rest.  Eat a well-balanced diet.  Drink enough fluids to keep your urine clear or pale yellow.  Keep all follow-up visits as directed by your health care provider. SEEK MEDICAL CARE IF:  You have painful urination.  You have abdominal pain.  You have vaginal discharge.  You have painful sexual intercourse.  You have bleeding between periods and after sex.  You have a fever. SEEK IMMEDIATE MEDICAL CARE IF:   You experience nausea or vomiting.  You experience excessive sweating (diaphoresis).  You have difficulty swallowing.   This information is not intended to replace advice given to you by your health care provider. Make sure you discuss any questions you have with your health care provider.   Document Released: 11/17/2004 Document Revised: 10/29/2014 Document Reviewed: 10/15/2012 Elsevier Interactive Patient Education 2016 [employer]  

## 2015-12-15 NOTE — Progress Notes (Signed)
   PRENATAL VISIT NOTE  Subjective:  Erica Pham is a 25 y.o. G1P0 at 5016w1d being seen today for ongoing prenatal care.  She is currently monitored for the following issues for this low-risk pregnancy and has Supervision of low-risk pregnancy on her problem list.  Patient reports no complaints.  Contractions: Not present. Vag. Bleeding: None.  Movement: Present. Denies leaking of fluid.   The following portions of the patient's history were reviewed and updated as appropriate: allergies, current medications, past family history, past medical history, past social history, past surgical history and problem list. Problem list updated.  Objective:   Vitals:   12/15/15 0826  BP: 124/78  Pulse: 98  Temp: 97.6 F (36.4 C)  Weight: 198 lb 6.4 oz (90 kg)    Fetal Status: Fetal Heart Rate (bpm): 139   Movement: Present     General:  Alert, oriented and cooperative. Patient is in no acute distress.  Skin: Skin is warm and dry. No rash noted.   Cardiovascular: Normal heart rate noted  Respiratory: Normal respiratory effort, no problems with respiration noted  Abdomen: Soft, gravid, appropriate for gestational age. Pain/Pressure: Absent     Pelvic:  Cervical exam deferred        Extremities: Normal range of motion.  Edema: None  Mental Status: Normal mood and affect. Normal behavior. Normal judgment and thought content.   Assessment and Plan:  Pregnancy: G1P0 at 7016w1d  1. Encounter for supervision of low-risk pregnancy in third trimester CT needs tx, notify partner  Preterm labor symptoms and general obstetric precautions including but not limited to vaginal bleeding, contractions, leaking of fluid and fetal movement were reviewed in detail with the patient. Please refer to After Visit Summary for other counseling recommendations.  Return in about 1 week (around 12/22/2015).  Adam PhenixJames G Arnold, MD

## 2015-12-15 NOTE — Progress Notes (Signed)
Patient stated that she is feeling good other than being tired, reports good fetal movement.

## 2015-12-22 ENCOUNTER — Ambulatory Visit (INDEPENDENT_AMBULATORY_CARE_PROVIDER_SITE_OTHER): Payer: BLUE CROSS/BLUE SHIELD | Admitting: Obstetrics and Gynecology

## 2015-12-22 VITALS — BP 110/74 | HR 102

## 2015-12-22 DIAGNOSIS — O98813 Other maternal infectious and parasitic diseases complicating pregnancy, third trimester: Principal | ICD-10-CM

## 2015-12-22 DIAGNOSIS — O98313 Other infections with a predominantly sexual mode of transmission complicating pregnancy, third trimester: Secondary | ICD-10-CM

## 2015-12-22 DIAGNOSIS — A749 Chlamydial infection, unspecified: Secondary | ICD-10-CM | POA: Diagnosis not present

## 2015-12-22 DIAGNOSIS — Z3490 Encounter for supervision of normal pregnancy, unspecified, unspecified trimester: Secondary | ICD-10-CM

## 2015-12-22 NOTE — Progress Notes (Signed)
   PRENATAL VISIT NOTE  Subjective:  Erica Pham is a 25 y.o. G1P0 at 6073w1d being seen today for ongoing prenatal care.  She is currently monitored for the following issues for this low-risk pregnancy and has Supervision of low-risk pregnancy and Chlamydia infection affecting pregnancy, antepartum, third trimester on her problem list.  Patient reports no complaints.  Contractions: Irregular. Vag. Bleeding: None.  Movement: Present. Denies leaking of fluid.   The following portions of the patient's history were reviewed and updated as appropriate: allergies, current medications, past family history, past medical history, past social history, past surgical history and problem list. Problem list updated.  Objective:   Vitals:   12/22/15 1000  BP: 110/74  Pulse: (!) 102    Fetal Status: Fetal Heart Rate (bpm): 139 Fundal Height: 37 cm Movement: Present     General:  Alert, oriented and cooperative. Patient is in no acute distress.  Skin: Skin is warm and dry. No rash noted.   Cardiovascular: Normal heart rate noted  Respiratory: Normal respiratory effort, no problems with respiration noted  Abdomen: Soft, gravid, appropriate for gestational age. Pain/Pressure: Present     Pelvic:  Cervical exam deferred        Extremities: Normal range of motion.  Edema: None  Mental Status: Normal mood and affect. Normal behavior. Normal judgment and thought content.   Assessment and Plan:  Pregnancy: G1P0 at 1173w1d  1. Encounter for supervision of low-risk pregnancy, antepartum Patient is doing well Answered questions regarding recent diagnosis of chlamydia. Patient received treatment on 10/24 Pediatricians will be [facility]  Term labor symptoms and general obstetric precautions including but not limited to vaginal bleeding, contractions, leaking of fluid and fetal movement were reviewed in detail with the patient. Please refer to After Visit  Summary for other counseling recommendations.  No Follow-up on file.  Catalina AntiguaPeggy Glyn Zendejas, MD

## 2015-12-29 ENCOUNTER — Encounter: Payer: Self-pay | Admitting: *Deleted

## 2015-12-29 ENCOUNTER — Encounter: Payer: BLUE CROSS/BLUE SHIELD | Admitting: Obstetrics and Gynecology

## 2015-12-29 ENCOUNTER — Ambulatory Visit (INDEPENDENT_AMBULATORY_CARE_PROVIDER_SITE_OTHER): Payer: BLUE CROSS/BLUE SHIELD | Admitting: Certified Nurse Midwife

## 2015-12-29 VITALS — BP 138/44 | HR 108 | Wt 206.0 lb

## 2015-12-29 DIAGNOSIS — Z3493 Encounter for supervision of normal pregnancy, unspecified, third trimester: Secondary | ICD-10-CM | POA: Diagnosis not present

## 2015-12-29 NOTE — Patient Instructions (Addendum)
Vaginal Delivery °During delivery, your health care provider will help you give birth to your baby. During a vaginal delivery, you will work to push the baby out of your vagina. However, before you can push your baby out, a few things need to happen. The opening of your uterus (cervix) has to soften, thin out, and open up (dilate) all the way to 10 cm. Also, your baby has to move down from the uterus into your vagina.  °SIGNS OF LABOR  °Your health care provider will first need to make sure you are in labor. Signs of labor include:  °· Passing what is called the mucous plug before labor begins. This is a small amount of blood-stained mucus. °· Having regular, painful uterine contractions.   °· The time between contractions gets shorter.   °· The discomfort and pain gradually get more intense. °· Contraction pains get worse when walking and do not go away when resting.   °· Your cervix becomes thinner (effacement) and dilates. °BEFORE THE DELIVERY °Once you are in labor and admitted into the hospital or care center, your health care provider may do the following:  °· Perform a complete physical exam. °· Review any complications related to pregnancy or labor.  °· Check your blood pressure, pulse, temperature, and heart rate (vital signs).   °· Determine if, and when, the rupture of amniotic membranes occurred. °· Do a vaginal exam (using a sterile glove and lubricant) to determine:   °¨ The position (presentation) of the baby. Is the baby's head presenting first (vertex) in the birth canal (vagina), or are the feet or buttocks first (breech)?   °¨ The level (station) of the baby's head within the birth canal.   °¨ The effacement and dilatation of the cervix.   °· An electronic fetal monitor is usually placed on your abdomen when you first arrive. This is used to monitor your contractions and the baby's heart rate. °¨ When the monitor is on your abdomen (external fetal monitor), it can only pick up the frequency and  length of your contractions. It cannot tell the strength of your contractions. °¨ If it becomes necessary for your health care provider to know exactly how strong your contractions are or to see exactly what the baby's heart rate is doing, an internal monitor may be inserted into your vagina and uterus. Your health care provider will discuss the benefits and risks of using an internal monitor and obtain your permission before inserting the device. °¨ Continuous fetal monitoring may be needed if you have an epidural, are receiving certain medicines (such as oxytocin), or have pregnancy or labor complications. °· An IV access tube may be placed into a vein in your arm to deliver fluids and medicines if necessary. °THREE STAGES OF LABOR AND DELIVERY °Normal labor and delivery is divided into three stages. °First Stage °This stage starts when you begin to contract regularly and your cervix begins to efface and dilate. It ends when your cervix is completely open (fully dilated). The first stage is the longest stage of labor and can last from 3 hours to 15 hours.  °Several methods are available to help with labor pain. You and your health care provider will decide which option is best for you. Options include:  °· Opioid medicines. These are strong pain medicines that you can get through your IV tube or as a shot into your muscle. These medicines lessen pain but do not make it go away completely.  °· Epidural. A medicine is given through a thin tube that   is inserted in your back. The medicine numbs the lower part of your body and prevents any pain in that area. °· Paracervical pain medicine. This is an injection of an anesthetic on each side of your cervix.   °· You may request natural childbirth, which does not involve the use of pain medicines or an epidural during labor and delivery. Instead, you will use other things, such as breathing exercises, to help cope with the pain. °Second Stage °The second stage of labor  begins when your cervix is fully dilated at 10 cm. It continues until you push your baby down through the birth canal and the baby is born. This stage can take only minutes or several hours. °· The location of your baby's head as it moves through the birth canal is reported as a number called a station. If the baby's head has not started its descent, the station is described as being at minus 3 (-3). When your baby's head is at the zero station, it is at the middle of the birth canal and is engaged in the pelvis. The station of your baby helps indicate the progress of the second stage of labor. °· When your baby is born, your health care provider may hold the baby with his or her head lowered to prevent amniotic fluid, mucus, and blood from getting into the baby's lungs. The baby's mouth and nose may be suctioned with a small bulb syringe to remove any additional fluid. °· Your health care provider may then place the baby on your stomach. It is important to keep the baby from getting cold. To do this, the health care provider will dry the baby off, place the baby directly on your skin (with no blankets between you and the baby), and cover the baby with warm, dry blankets.   °· The umbilical cord is cut. °Third Stage °During the third stage of labor, your health care provider will deliver the placenta (afterbirth) and make sure your bleeding is under control. The delivery of the placenta usually takes about 5 minutes but can take up to 30 minutes. After the placenta is delivered, a medicine may be given either by IV or injection to help contract the uterus and control bleeding. If you are planning to breastfeed, you can try to do so now. °After you deliver the placenta, your uterus should contract and get very firm. If your uterus does not remain firm, your health care provider will massage it. This is important because the contraction of the uterus helps cut off bleeding at the site where the placenta was attached  to your uterus. If your uterus does not contract properly and stay firm, you may continue to bleed heavily. If there is a lot of bleeding, medicines may be given to contract the uterus and stop the bleeding.  °  °This information is not intended to replace advice given to you by your health care provider. Make sure you discuss any questions you have with your health care provider. °  °Document Released: 11/17/2007 Document Revised: 02/28/2014 Document Reviewed: 10/05/2011 °Elsevier Interactive Patient Education ©2016 [employer] ° °

## 2015-12-29 NOTE — Progress Notes (Signed)
Subjective:    Erica Pham is a 25 y.o. female being seen today for her obstetrical visit. She is at 2859w1d gestation. Patient reports no bleeding, no leaking and occasional contractions. Fetal movement: normal.  Problem List Items Addressed This Visit      Other   Supervision of low-risk pregnancy - Primary     Patient Active Problem List   Diagnosis Date Noted  . Chlamydia infection affecting pregnancy, antepartum, third trimester 12/15/2015  . Supervision of low-risk pregnancy 05/28/2015    Objective:    BP (!) 138/44   Pulse (!) 108   Wt 206 lb (93.4 kg)   LMP 04/06/2015   BMI 35.36 kg/m  FHT: 136 BPM  Uterine Size: 39 cm and size equals dates  Presentations: cephalic  Pelvic Exam:              Dilation: 1cm       Effacement: Long             Station:  -3    Consistency: medium            Position: middle     Assessment:    Pregnancy @ 859w1d weeks   Waterbirth planned  Plan:   Plans for delivery: Vaginal anticipated; labs reviewed; problem list updated Counseling: Consent signed for waterbirth. Infant feeding: plans to breastfeed. Cigarette smoking: never smoked. L&D discussion: symptoms of labor, discussed when to call, discussed what number to call, anesthetic/analgesic options reviewed and delivering clinician:  plans Certified Nurse-Midwife. Postpartum supports and preparation: circumcision discussed and contraception plans discussed.  Follow up in 1 Week.

## 2016-01-04 ENCOUNTER — Encounter (HOSPITAL_COMMUNITY): Payer: Self-pay | Admitting: *Deleted

## 2016-01-04 ENCOUNTER — Inpatient Hospital Stay (HOSPITAL_COMMUNITY)
Admission: AD | Admit: 2016-01-04 | Discharge: 2016-01-06 | DRG: 775 | Disposition: A | Discharge status: Home / Self Care | Payer: BLUE CROSS/BLUE SHIELD | Source: Ambulatory Visit | Attending: Family Medicine | Admitting: Family Medicine

## 2016-01-04 DIAGNOSIS — O4290 Premature rupture of membranes, unspecified as to length of time between rupture and onset of labor, unspecified weeks of gestation: Secondary | ICD-10-CM | POA: Diagnosis present

## 2016-01-04 DIAGNOSIS — Z3A39 39 weeks gestation of pregnancy: Secondary | ICD-10-CM

## 2016-01-04 DIAGNOSIS — O4202 Full-term premature rupture of membranes, onset of labor within 24 hours of rupture: Secondary | ICD-10-CM | POA: Diagnosis present

## 2016-01-04 DIAGNOSIS — Z833 Family history of diabetes mellitus: Secondary | ICD-10-CM | POA: Diagnosis not present

## 2016-01-04 HISTORY — DX: Other specified health status: Z78.9

## 2016-01-04 LAB — CBC
HCT: 36.5 % (ref 36.0–46.0)
Hemoglobin: 12.2 g/dL (ref 12.0–15.0)
MCH: 30.3 pg (ref 26.0–34.0)
MCHC: 33.4 g/dL (ref 30.0–36.0)
MCV: 90.8 fL (ref 78.0–100.0)
PLATELETS: 167 10*3/uL (ref 150–400)
RBC: 4.02 MIL/uL (ref 3.87–5.11)
RDW: 14.3 % (ref 11.5–15.5)
WBC: 11 10*3/uL — ABNORMAL HIGH (ref 4.0–10.5)

## 2016-01-04 LAB — TYPE AND SCREEN
ABO/RH(D): A POS
Antibody Screen: NEGATIVE

## 2016-01-04 LAB — ABO/RH: ABO/RH(D): A POS

## 2016-01-04 MED ORDER — ACETAMINOPHEN 325 MG PO TABS: 650.0000 mg | ORAL_TABLET | ORAL | Status: DC | PRN

## 2016-01-04 MED ORDER — TERBUTALINE SULFATE 1 MG/ML IJ SOLN
0.2500 mg | Freq: Once | INTRAMUSCULAR | Status: DC | PRN
  Filled 2016-01-04: qty 1

## 2016-01-04 MED ORDER — OXYCODONE-ACETAMINOPHEN 5-325 MG PO TABS: 2.0000 | ORAL_TABLET | ORAL | Status: DC | PRN

## 2016-01-04 MED ORDER — OXYTOCIN BOLUS FROM INFUSION
500.0000 mL | Freq: Once | INTRAVENOUS | Status: DC
Start: 2016-01-04 — End: 2016-01-05

## 2016-01-04 MED ORDER — OXYCODONE-ACETAMINOPHEN 5-325 MG PO TABS: 1.0000 | ORAL_TABLET | ORAL | Status: DC | PRN

## 2016-01-04 MED ORDER — LIDOCAINE HCL (PF) 1 % IJ SOLN
30.0000 mL | INTRAMUSCULAR | Status: DC | PRN
  Filled 2016-01-04: qty 30

## 2016-01-04 MED ORDER — SOD CITRATE-CITRIC ACID 500-334 MG/5ML PO SOLN: 30.0000 mL | ORAL | Status: DC | PRN

## 2016-01-04 MED ORDER — OXYTOCIN 40 UNITS IN LACTATED RINGERS INFUSION - SIMPLE MED
1.0000 m[IU]/min | INTRAVENOUS | Status: DC
  Administered 2016-01-04: 2 m[IU]/min via INTRAVENOUS
  Filled 2016-01-04 (×2): qty 1000

## 2016-01-04 MED ORDER — LACTATED RINGERS IV SOLN
500.0000 mL | INTRAVENOUS | Status: DC | PRN
  Administered 2016-01-04: 500 mL via INTRAVENOUS

## 2016-01-04 MED ORDER — OXYTOCIN 40 UNITS IN LACTATED RINGERS INFUSION - SIMPLE MED
2.5000 [IU]/h | INTRAVENOUS | Status: DC
Start: 2016-01-04 — End: 2016-01-05

## 2016-01-04 MED ORDER — LACTATED RINGERS IV SOLN
INTRAVENOUS | Status: DC
  Administered 2016-01-05: 01:00:00 via INTRAVENOUS

## 2016-01-04 MED ORDER — ONDANSETRON HCL 4 MG/2ML IJ SOLN: 4.0000 mg | Freq: Four times a day (QID) | INTRAMUSCULAR | Status: DC | PRN

## 2016-01-04 NOTE — MAU Note (Signed)
OK for pt to go to room 171 per Candis SchatzH. Koran RN

## 2016-01-04 NOTE — H&P (Signed)
LABOR AND DELIVERY ADMISSION HISTORY AND PHYSICAL NOTE  Erica Pham is a 25 y.o. female G1P0000 with IUP at 7330w0d by LMP presenting for PROM at term.   She reports positive fetal movement. She states that she started noticing leaking last night at 9 pm; fern test 18 hours later is positive. Denies bleeding or contractions. Is resistant to IOL because she desires a natural water birth. She has had a positive chlamydia test, treated, but with no test to cure.   Prenatal History/Complications: Denied by Pt  Past Medical History:     Past Medical History:  Diagnosis Date  . Medical history non-contributory     Past Surgical History:      Past Surgical History:  Procedure Laterality Date  . NO PAST SURGERIES      Obstetrical History: OB History    Gravida Para Term Preterm AB Living   1 0 0 0 0 0   SAB TAB Ectopic Multiple Live Births   0 0 0 0 0      Social History: Social History        Social History  . Marital status: Single    Spouse name: N/A  . Number of children: N/A  . Years of education: N/A         Social History Main Topics  . Smoking status: Never Smoker  . Smokeless tobacco: Never Used  . Alcohol use No     Comment: not currently  . Drug use: No  . Sexual activity: Yes    Birth control/ protection: Pill       Other Topics Concern  . None      Social History Narrative  . None    Family History:      Family History  Problem Relation Age of Onset  . Diabetes Maternal Grandfather     Allergies:     Allergies  Allergen Reactions  . Prednisone Shortness Of Breath           Prescriptions Prior to Admission  Medication Sig Dispense Refill Last Dose  . Prenat w/o A-FeCbn-DSS-FA-DHA (CITRANATAL HARMONY) 29-1-265 MG CAPS Take 1 capsule by mouth at bedtime.   Past Week at Unknown time     Review of Systems   All systems reviewed and negative except as stated in HPI  Blood pressure  120/93, pulse 108, temperature 98.3 F (36.8 C), temperature source Oral, resp. rate 18, height 5\' 4"  (1.626 m), weight 93.4 kg (206 lb), last menstrual period 04/06/2015, SpO2 96 %. General appearance: alert, cooperative, appears stated age and no distress Lungs: clear to auscultation bilaterally Heart: regular rate and rhythm Abdomen: soft, non-tender; bowel sounds normal Extremities: No calf swelling or tenderness Presentation: cephalic Fetal monitoring: FHR 140 baseline with mod variability, +accels Uterine activity: Normal Dilation: 3 Effacement (%): 70 Station: -2 Exam by:: Fabian NovemberM. Deatra Mcmahen, CNM   Prenatal labs: ABO, Rh: A/Positive/-- (04/06 1052) Antibody: Negative (04/06 1052) Rubella: !Error! Immune RPR: Non Reactive (08/25 1115)  HBsAg: Negative (04/06 1052)  HIV: Non Reactive (08/25 1115)  GBS:   Neg 1 hr Glucola: 3rd trimester GTT 77/100/94 Genetic screening: Declined Anatomy US: Female  Prenatal Transfer Tool  Maternal Diabetes: No Genetic Screening: Declined Maternal Ultrasounds/Referrals: Normal Fetal Ultrasounds or other Referrals:  None Maternal Substance Abuse:  No Significant Maternal Medications:  None Significant Maternal Lab Results: Lab values include: Group B Strep negative  No results found for this or any previous visit (from the past 24 hour(s)).  Patient Active Problem List   Diagnosis Date Noted  . Chlamydia infection affecting pregnancy, antepartum, third trimester 12/15/2015  . Supervision of low-risk pregnancy 05/28/2015    Assessment: [redacted] weeks gestation PROM at Term Cat I FHT GBS neg Admit to L&D   Dicussed with pt and partner that +CT without neg TOC is contraindication for water immersion and waterbirth, she is understanding. Recommend IOL with either Pitocin or Cytotec now d/t prolonged ROM, discussed risk of infection for baby as well as her if we await spontaneous labor. She desires to think about her options. Doula at  bedside.    Donette LarryMelanie Jaben Benegas, CNM  01/04/2016 6:00 PM

## 2016-01-04 NOTE — MAU Note (Signed)
Urine in lab 

## 2016-01-04 NOTE — MAU Note (Signed)
Pt states she started ? Leaking sometime last night, had large gush of clear fluid an hour ago, had wet clothes on arrival to MAU.  Having some back pain, denies bleeding.

## 2016-01-04 NOTE — Progress Notes (Signed)
Patient ID: Erica OsgoodSontara Pham, female   DOB: 05/30/1990, 25 y.o.   MRN: [id number] Doing well, not hurting much yet  Vitals:   01/04/16 1839 01/04/16 1928 01/04/16 2037 01/04/16 2100  BP: 126/68 125/81 (!) 119/98   Pulse: (!) 124 (!) 123 (!) 119   Resp: 18 16 17    Temp:    98.7 F (37.1 C)  TempSrc:    Oral  SpO2:      Weight:      Height:       FHR reactive, Category I  UCs every 3 min  Dilation: 2 Effacement (%): 80 Cervical Position: Middle Station: -3 Presentation: Vertex Exam by:: Wynelle BourgeoisMarie Edgerrin Correia, CNM  Test of cure for chlamydia done  Continue to observe

## 2016-01-04 NOTE — Anesthesia Pain Management Evaluation Note (Signed)
  CRNA Pain Management Visit Note  Patient: Erica Pham, 25 y.o., female  "Hello [facility] am a [facility] of the [facility]. We have an anesthesia team available at all times to provide care throughout the hospital, including epidural management and anesthesia for C-section. I don't know your plan for the delivery whether it a natural birth, water birth, IV sedation, nitrous supplementation, doula or epidural, but we want to meet your pain goals."   1.Was your pain managed to your expectations on prior hospitalizations?   No prior hospitalizations  2.What is your expectation for pain management during this hospitalization?     Water tub  3.How can we help you reach that goal? Be available if needed  Record the patient's initial score and the patient's pain goal.   Pain: 0  Pain Goal: 5 [facility] wants you to be able to say your pain was always managed very well.  O[facility]. [facility] Va Medical CenterMERRITT,Pierrette Scheu 01/04/2016

## 2016-01-04 NOTE — H&P (Signed)
LABOR AND DELIVERY ADMISSION HISTORY AND PHYSICAL NOTE  Erica Pham is a 25 y.o. female G1P0000 with IUP at 7754w0d by LMP presenting for PROM at term.   She reports positive fetal movement. She states that she started noticing leaking last night at 9 pm; fern test 18 hours later is positive. Denies bleeding or contractions. Is resistant to IOL because she desires a natural water birth. She has had a positive chlamydia test, treated, but with no test to cure.   Prenatal History/Complications: Denied by Pt  Past Medical History: Past Medical History:  Diagnosis Date  . Medical history non-contributory     Past Surgical History: Past Surgical History:  Procedure Laterality Date  . NO PAST SURGERIES      Obstetrical History: OB History    Gravida Para Term Preterm AB Living   1 0 0 0 0 0   SAB TAB Ectopic Multiple Live Births   0 0 0 0 0      Social History: Social History   Social History  . Marital status: Single    Spouse name: N/A  . Number of children: N/A  . Years of education: N/A   Social History Main Topics  . Smoking status: Never Smoker  . Smokeless tobacco: Never Used  . Alcohol use No     Comment: not currently  . Drug use: No  . Sexual activity: Yes    Birth control/ protection: Pill   Other Topics Concern  . None   Social History Narrative  . None    Family History: Family History  Problem Relation Age of Onset  . Diabetes Maternal Grandfather     Allergies: Allergies  Allergen Reactions  . Prednisone Shortness Of Breath    Prescriptions Prior to Admission  Medication Sig Dispense Refill Last Dose  . Prenat w/o A-FeCbn-DSS-FA-DHA (CITRANATAL HARMONY) 29-1-265 MG CAPS Take 1 capsule by mouth at bedtime.   Past Week at Unknown time     Review of Systems   All systems reviewed and negative except as stated in HPI  Blood pressure 120/93, pulse 108, temperature 98.3 F (36.8 C), temperature source Oral, resp. rate 18, height 5'  4" (1.626 m), weight 93.4 kg (206 lb), last menstrual period 04/06/2015, SpO2 96 %. General appearance: alert, cooperative, appears stated age and no distress Lungs: clear to auscultation bilaterally Heart: regular rate and rhythm Abdomen: soft, non-tender; bowel sounds normal Extremities: No calf swelling or tenderness Presentation: cephalic Fetal monitoring: FHR 140 baseline with mod variability, +accels Uterine activity: Normal Dilation: 3 Effacement (%): 70 Station: -2 Exam by:: Fabian NovemberM. Bhambri, CNM   Prenatal labs: ABO, Rh: A/Positive/-- (04/06 1052) Antibody: Negative (04/06 1052) Rubella: !Error! Immune RPR: Non Reactive (08/25 1115)  HBsAg: Negative (04/06 1052)  HIV: Non Reactive (08/25 1115)  GBS:   Neg 1 hr Glucola: 3rd trimester GTT 77/100/94 Genetic screening: Declined Anatomy US: Female  Prenatal Transfer Tool  Maternal Diabetes: No Genetic Screening: Declined Maternal Ultrasounds/Referrals: Normal Fetal Ultrasounds or other Referrals:  None Maternal Substance Abuse:  No Significant Maternal Medications:  None Significant Maternal Lab Results: Lab values include: Group B Strep negative  No results found for this or any previous visit (from the past 24 hour(s)).  Patient Active Problem List   Diagnosis Date Noted  . Chlamydia infection affecting pregnancy, antepartum, third trimester 12/15/2015  . Supervision of low-risk pregnancy 05/28/2015    Assessment: Erica Pham is a 25 y.o. G1P0000 at 4554w0d here for PROM.  Admit to L&D for induction of labor. Immersion in water contraindicated due to chlamydia; this was explained to Pt and she was counseled that she can still walk around or get in the shower if she desires.    #Labor: PROM at term; IOL with Pitocin.  #Pain: Plan for natural birth with no pain control; epidural ordered just in case #FWB: Cat 1 #ID:  GBS neg #MOF: Breast #MOC: OCPs #Circ:  Yes, inpt (?)  Torunn Chancellor E Lewis-Bevan 01/04/2016, 3:42  PM

## 2016-01-04 NOTE — Progress Notes (Signed)
Off per CNM, intermittent EFM ordered

## 2016-01-05 ENCOUNTER — Encounter (HOSPITAL_COMMUNITY): Payer: Self-pay

## 2016-01-05 ENCOUNTER — Encounter: Payer: BLUE CROSS/BLUE SHIELD | Admitting: Certified Nurse Midwife

## 2016-01-05 DIAGNOSIS — O4202 Full-term premature rupture of membranes, onset of labor within 24 hours of rupture: Secondary | ICD-10-CM | POA: Diagnosis not present

## 2016-01-05 LAB — RPR: RPR Ser Ql: NONREACTIVE

## 2016-01-05 LAB — GC/CHLAMYDIA PROBE AMP (~~LOC~~) NOT AT ARMC
Chlamydia: NEGATIVE
Neisseria Gonorrhea: NEGATIVE

## 2016-01-05 MED ORDER — PRENATAL MULTIVITAMIN CH
1.0000 | ORAL_TABLET | Freq: Every day | ORAL | Status: DC
  Administered 2016-01-06: 1 via ORAL
  Filled 2016-01-05 (×2): qty 1

## 2016-01-05 MED ORDER — ONDANSETRON HCL 4 MG PO TABS: 4.0000 mg | ORAL_TABLET | ORAL | Status: DC | PRN

## 2016-01-05 MED ORDER — WITCH HAZEL-GLYCERIN EX PADS: 1.0000 "application " | MEDICATED_PAD | CUTANEOUS | Status: DC | PRN

## 2016-01-05 MED ORDER — DIPHENHYDRAMINE HCL 25 MG PO CAPS: 25.0000 mg | ORAL_CAPSULE | Freq: Four times a day (QID) | ORAL | Status: DC | PRN

## 2016-01-05 MED ORDER — IBUPROFEN 600 MG PO TABS
600.0000 mg | ORAL_TABLET | Freq: Four times a day (QID) | ORAL | Status: DC
  Administered 2016-01-05 – 2016-01-06 (×6): 600 mg via ORAL
  Filled 2016-01-05 (×6): qty 1

## 2016-01-05 MED ORDER — DIBUCAINE 1 % RE OINT: 1.0000 "application " | TOPICAL_OINTMENT | RECTAL | Status: DC | PRN

## 2016-01-05 MED ORDER — ACETAMINOPHEN 325 MG PO TABS: 650.0000 mg | ORAL_TABLET | ORAL | Status: DC | PRN

## 2016-01-05 MED ORDER — SENNOSIDES-DOCUSATE SODIUM 8.6-50 MG PO TABS
2.0000 | ORAL_TABLET | ORAL | Status: DC
  Administered 2016-01-05: 2 via ORAL
  Filled 2016-01-05: qty 2

## 2016-01-05 MED ORDER — BENZOCAINE-MENTHOL 20-0.5 % EX AERO
1.0000 "application " | INHALATION_SPRAY | CUTANEOUS | Status: DC | PRN
  Filled 2016-01-05: qty 56

## 2016-01-05 MED ORDER — TETANUS-DIPHTH-ACELL PERTUSSIS 5-2.5-18.5 LF-MCG/0.5 IM SUSP: 0.5000 mL | Freq: Once | INTRAMUSCULAR | Status: DC

## 2016-01-05 MED ORDER — ONDANSETRON HCL 4 MG/2ML IJ SOLN: 4.0000 mg | INTRAMUSCULAR | Status: DC | PRN

## 2016-01-05 MED ORDER — COCONUT OIL OIL: 1.0000 "application " | TOPICAL_OIL | Status: DC | PRN

## 2016-01-05 MED ORDER — FENTANYL CITRATE (PF) 100 MCG/2ML IJ SOLN
100.0000 ug | INTRAMUSCULAR | Status: DC | PRN
  Administered 2016-01-05 (×2): 100 ug via INTRAVENOUS
  Filled 2016-01-05: qty 2

## 2016-01-05 MED ORDER — SIMETHICONE 80 MG PO CHEW: 80.0000 mg | CHEWABLE_TABLET | ORAL | Status: DC | PRN

## 2016-01-05 MED ORDER — FENTANYL CITRATE (PF) 100 MCG/2ML IJ SOLN
INTRAMUSCULAR | Status: AC
  Filled 2016-01-05: qty 2

## 2016-01-05 MED ORDER — ZOLPIDEM TARTRATE 5 MG PO TABS: 5.0000 mg | ORAL_TABLET | Freq: Every evening | ORAL | Status: DC | PRN

## 2016-01-05 NOTE — Progress Notes (Signed)
Patient ID: [name], female   DOB: 10/05/1990, 25 y.o.   MRN: [id number] Doing well  Vitals:   01/04/16 2307 01/04/16 2311 01/05/16 0043 01/05/16 0118  BP:  (!) 121/54 (!) 151/123 (!) 144/112  Pulse:  100 92 83  Resp:  16 16 16   Temp:  99 F (37.2 C)  99.2 F (37.3 C)  TempSrc:  Oral  Oral  SpO2:      Weight: 206 lb (93.4 kg)     Height: 5\' 4"  (1.626 m)      FHR reassuring with good variability but accels do not meet criteria.  There are what appear to be early decelerations which mirror contractions  Dilation: 5 Effacement (%): 90 Cervical Position: Middle Station: 0 Presentation: Vertex Exam by:: Wynelle BourgeoisMarie Williams, CNM Done @ 0100  Will continue to observe

## 2016-01-05 NOTE — Progress Notes (Signed)
Patient ID: Erica OsgoodSontara Pham, female   DOB: 05/23/1990, 25 y.o.   MRN: [id number] Doing well  Vitals:   01/05/16 0043 01/05/16 0118 01/05/16 0214 01/05/16 0248  BP: (!) 151/123 (!) 144/112 134/69 125/60  Pulse: 92 83 (!) 103 (!) 101  Resp: 16 16 16 18   Temp:  99.2 F (37.3 C)  98 F (36.7 C)  TempSrc:  Oral  Oral  SpO2:      Weight:      Height:       FHR stable and reassuring  Dilation: 9 Effacement (%): 100 Cervical Position: Middle Station: +1 Presentation: Vertex Exam by:: Alfonzo BeersMolly Trott, RN   Anticipate second stage soon

## 2016-01-05 NOTE — Lactation Note (Signed)
This note was copied from a baby's chart. Lactation Consultation Note  Patient Name: Erica Germain OsgoodSontara Middendorf ZOXWR'UToday's Date: 01/05/2016 Reason for consult: Initial assessment   Initial assessment with first time mom of 7 hour old infant. Infant STS with mom after bath and in a deep sleep. Mom reports infant fed well after delivery and has not fed since.   Mom reports infant has been tongue sucking and that she was taught to do suck training by RN. Mom reports infant was offered a pacifier because she thought it may help. Enc mom to use suck training vs pacifier as suck is different on a pacifier.   Discussed with mom it is not unusual to have an infant that does not feed often during the first 24 hours. Enc mom to keep infant STS as much as possible to respond to feeding cues. LC phone # left on board for mom to call when infant showing feeding cues. Mom voiced understanding.   LC Brochure and BF Resources Handout given, mom informed of IP/OP Services, BF Support Groups and LC phone #. [facility] and was informed to call for appt after d/c. Advised mom to call out for next feeding.    Maternal Data Formula Feeding for Exclusion: No Has patient been taught Hand Expression?: Yes Does the patient have breastfeeding experience prior to this delivery?: No  Feeding    LATCH Score/Interventions Latch: Too sleepy or reluctant, no latch achieved, no sucking elicited. (Infant tongue sucking, tongue not relaxed, against palate)  Audible Swallowing: None  Type of Nipple: Flat  Comfort (Breast/Nipple): Soft / non-tender     Hold (Positioning): Full assist, staff holds infant at breast  LATCH Score: 3  Lactation Tools Discussed/Used WIC Program: Yes   Consult Status Consult Status: Follow-up Date: 01/05/16 Follow-up type: In-patient    Silas FloodSharon S Hice 01/05/2016, 11:15 AM

## 2016-01-05 NOTE — Progress Notes (Signed)
Post Partum Day #0 Subjective: no complaints, up ad lib, voiding and tolerating PO  Objective: Blood pressure 134/72, pulse (!) 109, temperature 98.3 F (36.8 C), resp. rate 20, height 5\' 4"  (1.626 m), weight 206 lb (93.4 kg), last menstrual period 04/06/2015, SpO2 96 %, unknown if currently breastfeeding.  Physical Exam:  General: alert, cooperative and no distress Lochia: appropriate Uterine Fundus: firm Incision: no significant drainage, no dehiscence, no significant erythema DVT Evaluation: No evidence of DVT seen on physical exam. No cords or calf tenderness. No significant calf/ankle edema.   Recent Labs  01/04/16 1730  HGB 12.2  HCT 36.5    Assessment/Plan: Breastfeeding, Lactation consult and Contraception POP   LOS: 1 day   Roe CoombsRachelle A Dwyne Pham, CNM 01/05/2016, 7:32 AM

## 2016-01-05 NOTE — Lactation Note (Signed)
This note was copied from a baby's chart. Lactation Consultation Note  Patient Name: Erica Pham BJYNW'GToday's Date: 01/05/2016 Reason for consult: Follow-up assessment;Difficult latch;Other (Comment) (Infant sucks tongue and lip.)  Baby 9 hours old. Assisted mom to latch baby in football position to right breast. Baby sleepy at breast initially, so undressed baby and enc mom to nurse STS. Baby latched deeply and suckled rhythmically for a few minutes, but then pushed away from breast and started sucking his tongue and lip. This LC placed a gloved finger in the baby's mouth to prevent baby from lip/tongue sucking, and then allowed baby to suckle finger for a few minutes. Attempted to latch again, and baby latched deeply and suckled rhythmically with lips flanged and mom reported comfort. However, this LC did not note any swallows. Mom was able to hand express some colostrum. Enc mom to offer lots of STS and nurse with cues. Reviewed how to "teacup" breast in order to assist baby with latching. Enc mom to call out for assistance from her nurse with latching as needed.    Maternal Data Formula Feeding for Exclusion: No Has patient been taught Hand Expression?: Yes Does the patient have breastfeeding experience prior to this delivery?: No  Feeding Feeding Type: Breast Fed Length of feed:  (LC assessed first 10 minutes of BF.)  LATCH Score/Interventions Latch: Repeated attempts needed to sustain latch, nipple held in mouth throughout feeding, stimulation needed to elicit sucking reflex. Intervention(s): Adjust position;Assist with latch;Breast compression  Audible Swallowing: None Intervention(s): Skin to skin;Hand expression  Type of Nipple: Everted at rest and after stimulation (short shaft)  Comfort (Breast/Nipple): Soft / non-tender     Hold (Positioning): Assistance needed to correctly position infant at breast and maintain latch. Intervention(s): Breastfeeding basics  reviewed;Support Pillows;Position options;Skin to skin  LATCH Score: 6  Lactation Tools Discussed/Used WIC Program: Yes   Consult Status Consult Status: Follow-up Date: 01/06/16 Follow-up type: In-patient    Erica HayJennifer D Terri Pham 01/05/2016, 1:32 PM

## 2016-01-05 NOTE — Progress Notes (Signed)
Patient and belongings transferred via wheelchair without difficulty to room 108 holding infant and in company of family.

## 2016-01-06 MED ORDER — IBUPROFEN 600 MG PO TABS: 600.0000 mg | ORAL_TABLET | Freq: Four times a day (QID) | ORAL | 2 refills | Status: DC

## 2016-01-06 MED ORDER — ACETAMINOPHEN-CODEINE #3 300-30 MG PO TABS: 1.0000 | ORAL_TABLET | ORAL | 0 refills | Status: DC | PRN

## 2016-01-06 MED ORDER — SENNOSIDES-DOCUSATE SODIUM 8.6-50 MG PO TABS: 2.0000 | ORAL_TABLET | Freq: Two times a day (BID) | ORAL | 2 refills | Status: DC

## 2016-01-06 NOTE — Progress Notes (Signed)
Post Partum Day #1 Subjective: no complaints, up ad lib, voiding and tolerating PO  Objective: Blood pressure (!) 105/59, pulse 84, temperature 97.8 F (36.6 C), temperature source Oral, resp. rate 18, height 5\' 4"  (1.626 m), weight 206 lb (93.4 kg), last menstrual period 04/06/2015, SpO2 100 %, unknown if currently breastfeeding.  Physical Exam:  General: alert, cooperative and no distress Lochia: appropriate Uterine Fundus: firm Incision: no significant drainage, no dehiscence, no significant erythema DVT Evaluation: No evidence of DVT seen on physical exam. No cords or calf tenderness. No significant calf/ankle edema.   Recent Labs  01/04/16 1730  HGB 12.2  HCT 36.5    Assessment/Plan: Discharge home, Breastfeeding and Contraception POP   LOS: 2 days   Roe CoombsRachelle A Emmerson Shuffield, CNM 01/06/2016, 7:21 AM

## 2016-01-06 NOTE — Lactation Note (Signed)
This note was copied from a baby's chart. Lactation Consultation Note  Patient Name: Erica Germain OsgoodSontara Pender UJWJX'BToday's Date: 01/06/2016    Mom was concerned about infant's feeding patterns; parents were educated about initial newborn behavior (and during the 2nd night). During consult, infant was awake & content to be held [facility], but did not show an interest in latching. Mom reports that her breasts are heavier today & with palpation, her breasts do seem as as if they are filling. Mom has no discomfort w/latch. Mom is able to identify signs/sound of swallowing. Specifics of an asymmetric latch shown via [employer] website animation.   Mom knows that she is welcome to call us for a latch check when infant is ready to eat again.   Lurline HareRichey, [facility] 01/06/2016, 9:27 AM

## 2016-01-06 NOTE — Discharge Summary (Signed)
Obstetric Discharge Summary Reason for Admission: rupture of membranes and at 7081w0d Prenatal Procedures: ultrasound Intrapartum Procedures: spontaneous vaginal delivery Postpartum Procedures: none Complications-Operative and Postpartum: 2nd degree perineal laceration Hemoglobin  Date Value Ref Range Status  01/04/2016 12.2 12.0 - 15.0 g/dL Final   HCT  Date Value Ref Range Status  01/04/2016 36.5 36.0 - 46.0 % Final   Hematocrit  Date Value Ref Range Status  10/16/2015 36.2 34.0 - 46.6 % Final    Physical Exam:  General: alert, cooperative and no distress Lochia: appropriate Uterine Fundus: firm Incision: no significant drainage, no dehiscence, no significant erythema DVT Evaluation: No evidence of DVT seen on physical exam. No cords or calf tenderness. No significant calf/ankle edema.  Discharge Diagnoses: Term Pregnancy-delivered  Discharge Information: Date: 01/06/2016 Activity: pelvic rest Diet: routine Medications: PNV, Ibuprofen, Colace and Percocet Condition: stable Instructions: refer to practice specific booklet Discharge to: home Follow-up Information    Erica Pham A Erica Pham, CNM Follow up in 4 week(s).   Specialty:  Certified Nurse Midwife Contact information: [address] [place] [id number] [phone]           Newborn Data: Live born female  Birth Weight: 7 lb 15.3 oz (3610 g) APGAR: 8, 9  Home with mother.  Erica Pham, CNM 01/06/2016, 7:25 AM

## 2016-02-02 ENCOUNTER — Ambulatory Visit (INDEPENDENT_AMBULATORY_CARE_PROVIDER_SITE_OTHER): Payer: BLUE CROSS/BLUE SHIELD | Admitting: Obstetrics

## 2016-02-02 ENCOUNTER — Encounter: Payer: Self-pay | Admitting: Obstetrics

## 2016-02-02 DIAGNOSIS — Z30011 Encounter for initial prescription of contraceptive pills: Secondary | ICD-10-CM

## 2016-02-02 MED ORDER — NORETHINDRONE 0.35 MG PO TABS: 1.0000 | ORAL_TABLET | Freq: Every day | ORAL | 11 refills | Status: DC

## 2016-02-02 NOTE — Progress Notes (Signed)
Subjective:     Erica Pham is a 25 y.o. female who presents for a postpartum visit. She is 4 weeks postpartum following a spontaneous vaginal delivery. I have fully reviewed the prenatal and intrapartum course. The delivery was at 39 gestational weeks. Outcome: spontaneous vaginal delivery. Anesthesia: IV sedation. Postpartum course has been normal. Baby's course has been normal. Baby is feeding by breast. Bleeding thin lochia. Bowel function is normal. Bladder function is normal. Patient is not sexually active. Contraception method is abstinence. Postpartum depression screening: negative.  Tobacco, alcohol and substance abuse history reviewed.  Adult immunizations reviewed including TDAP, rubella and varicella.  The following portions of the patient's history were reviewed and updated as appropriate: allergies, current medications, past family history, past medical history, past social history, past surgical history and problem list.  Review of Systems A comprehensive review of systems was negative.   Objective:    BP 107/70   Pulse 85   Temp 98.3 F (36.8 C) (Oral)   Wt 180 lb (81.6 kg)   Breastfeeding? Yes   BMI 30.90 kg/m    PE:  Deferred    >50% of 10 min visit spent on counseling and coordination of care.    Assessment:    4 weeks postpartum.  Doing well.  Contraceptive Counseling and Advice.  Wants OCP's.  Plan:    1. Contraception: oral progesterone-only contraceptive 2. Micronor Rx 3. Follow up in: 4 weeks or as needed.  2hr GTT for h/o GDM/screening for DM q 3 yrs per ADA recommendations Preconception counseling provided Healthy lifestyle practices reviewed

## 2016-02-11 ENCOUNTER — Telehealth: Payer: Self-pay

## 2016-02-11 NOTE — Telephone Encounter (Signed)
Patient called in and wanted to know if it's ok to use inhaler while breastfeeding, advised patient that it is ok per provider's approval.

## 2016-04-26 ENCOUNTER — Encounter: Payer: Self-pay | Admitting: Obstetrics

## 2016-04-26 ENCOUNTER — Ambulatory Visit (INDEPENDENT_AMBULATORY_CARE_PROVIDER_SITE_OTHER): Payer: BLUE CROSS/BLUE SHIELD | Admitting: Obstetrics

## 2016-04-26 VITALS — BP 116/82 | HR 99 | Ht 64.0 in | Wt 173.3 lb

## 2016-04-26 DIAGNOSIS — Z3009 Encounter for other general counseling and advice on contraception: Secondary | ICD-10-CM

## 2016-04-26 DIAGNOSIS — Z3202 Encounter for pregnancy test, result negative: Secondary | ICD-10-CM

## 2016-04-26 DIAGNOSIS — Z3041 Encounter for surveillance of contraceptive pills: Secondary | ICD-10-CM | POA: Diagnosis not present

## 2016-04-26 LAB — POCT URINE PREGNANCY: PREG TEST UR: NEGATIVE

## 2016-04-26 MED ORDER — NORETHINDRONE 0.35 MG PO TABS: 1.0000 | ORAL_TABLET | Freq: Every day | ORAL | 11 refills | Status: DC

## 2016-04-26 NOTE — Progress Notes (Signed)
Subjective:    Erica Pham is a 26 y.o. female who presents for contraception counseling. The patient has no complaints today. The patient is sexually active. Pertinent past medical history: none.  She was considering switching from OCP's to IUD, but would like to further discuss options.  The information documented in the HPI was reviewed and verified.  Menstrual History: OB History    Gravida Para Term Preterm AB Living   1 1 1  0 0 1   SAB TAB Ectopic Multiple Live Births   0 0 0 0 1       No LMP recorded.   Patient Active Problem List   Diagnosis Date Noted  . PROM (premature rupture of membranes) 01/04/2016  . Chlamydia infection affecting pregnancy, antepartum, third trimester 12/15/2015  . Supervision of low-risk pregnancy 05/28/2015   Past Medical History:  Diagnosis Date  . Medical history non-contributory     Past Surgical History:  Procedure Laterality Date  . NO PAST SURGERIES       Current Outpatient Prescriptions:  .  Prenat w/o A-FeCbn-DSS-FA-DHA (CITRANATAL HARMONY) 29-1-265 MG CAPS, Take 1 capsule by mouth at bedtime., Disp: , Rfl:  .  acetaminophen-codeine (TYLENOL #3) 300-30 MG tablet, Take 1-2 tablets by mouth every 4 (four) hours as needed for moderate pain. (Patient not taking: Reported on 04/26/2016), Disp: 30 tablet, Rfl: 0 .  ibuprofen (ADVIL,MOTRIN) 600 MG tablet, Take 1 tablet (600 mg total) by mouth every 6 (six) hours. (Patient not taking: Reported on 04/26/2016), Disp: 120 tablet, Rfl: 2 .  norethindrone (MICRONOR,CAMILA,ERRIN) 0.35 MG tablet, Take 1 tablet (0.35 mg total) by mouth daily., Disp: 1 Package, Rfl: 11 .  senna-docusate (SENOKOT-S) 8.6-50 MG tablet, Take 2 tablets by mouth 2 (two) times daily. (Patient not taking: Reported on 04/26/2016), Disp: 90 tablet, Rfl: 2 Allergies  Allergen Reactions  . Prednisone Shortness Of Breath    Social History  Substance Use Topics  . Smoking status: Never Smoker  . Smokeless tobacco: Never Used  .  Alcohol use No     Comment: not currently    Family History  Problem Relation Age of Onset  . Diabetes Maternal Grandfather        Review of Systems Constitutional: negative for weight loss Genitourinary:negative for abnormal menstrual periods and vaginal discharge   Objective:   BP 116/82   Pulse 99   Ht 5\' 4"  (1.626 m)   Wt 173 lb 4.8 oz (78.6 kg)   Breastfeeding? Yes   BMI 29.75 kg/m    PE:  Deferred  Lab Review Urine pregnancy test Labs reviewed yes Radiologic studies reviewed no  >50% of 10 min visit spent on counseling and coordination of care.    Assessment:    26 y.o., continuing oral progesterone-only contraceptive ( breast feeding ), no contraindications.   Plan:    All questions answered. Contraception: oral progesterone-only contraceptive. Discussed healthy lifestyle modifications. Follow up in 3 months. Pregnancy test, result: negative.    Meds ordered this encounter  Medications  . norethindrone (MICRONOR,CAMILA,ERRIN) 0.35 MG tablet    Sig: Take 1 tablet (0.35 mg total) by mouth daily.    Dispense:  1 Package    Refill:  11   Orders Placed This Encounter  Procedures  . POCT urine pregnancy

## 2016-04-26 NOTE — Progress Notes (Signed)
Pt presents for IUD Mirena insertion. She is currently taking BCP but prefers IUD. UPT neg.

## 2017-01-11 ENCOUNTER — Telehealth: Payer: Self-pay

## 2017-01-11 ENCOUNTER — Ambulatory Visit: Payer: BLUE CROSS/BLUE SHIELD | Admitting: Obstetrics

## 2017-01-11 ENCOUNTER — Other Ambulatory Visit: Payer: Self-pay | Admitting: Obstetrics

## 2017-01-11 DIAGNOSIS — Z3041 Encounter for surveillance of contraceptive pills: Secondary | ICD-10-CM

## 2017-01-11 MED ORDER — NORETHINDRONE-ETH ESTRADIOL 1-35 MG-MCG PO TABS
1.0000 | ORAL_TABLET | Freq: Every day | ORAL | 11 refills | Status: DC
Start: 2017-01-11 — End: 2017-10-05

## 2017-01-11 NOTE — Telephone Encounter (Signed)
Ortho Novum 1/35 Rx. D/C Micronor.

## 2017-01-11 NOTE — Telephone Encounter (Signed)
Pt states that she would like to have her birth control dosage changed. She states that she is no longer breastfeeding.

## 2017-01-11 NOTE — Progress Notes (Signed)
Pt informed

## 2017-04-01 IMAGING — DX DG CHEST 2V
2 series · 2 of 2 positions shown · non-contrast
Comparison: None.

CLINICAL DATA: Cough, shortness of breath, and weakness for 1 week.
Diagnosed with upper respiratory tract infection yesterday at
primary physician. Symptoms worsening today.

EXAM:
CHEST  2 VIEW

[chest pa]
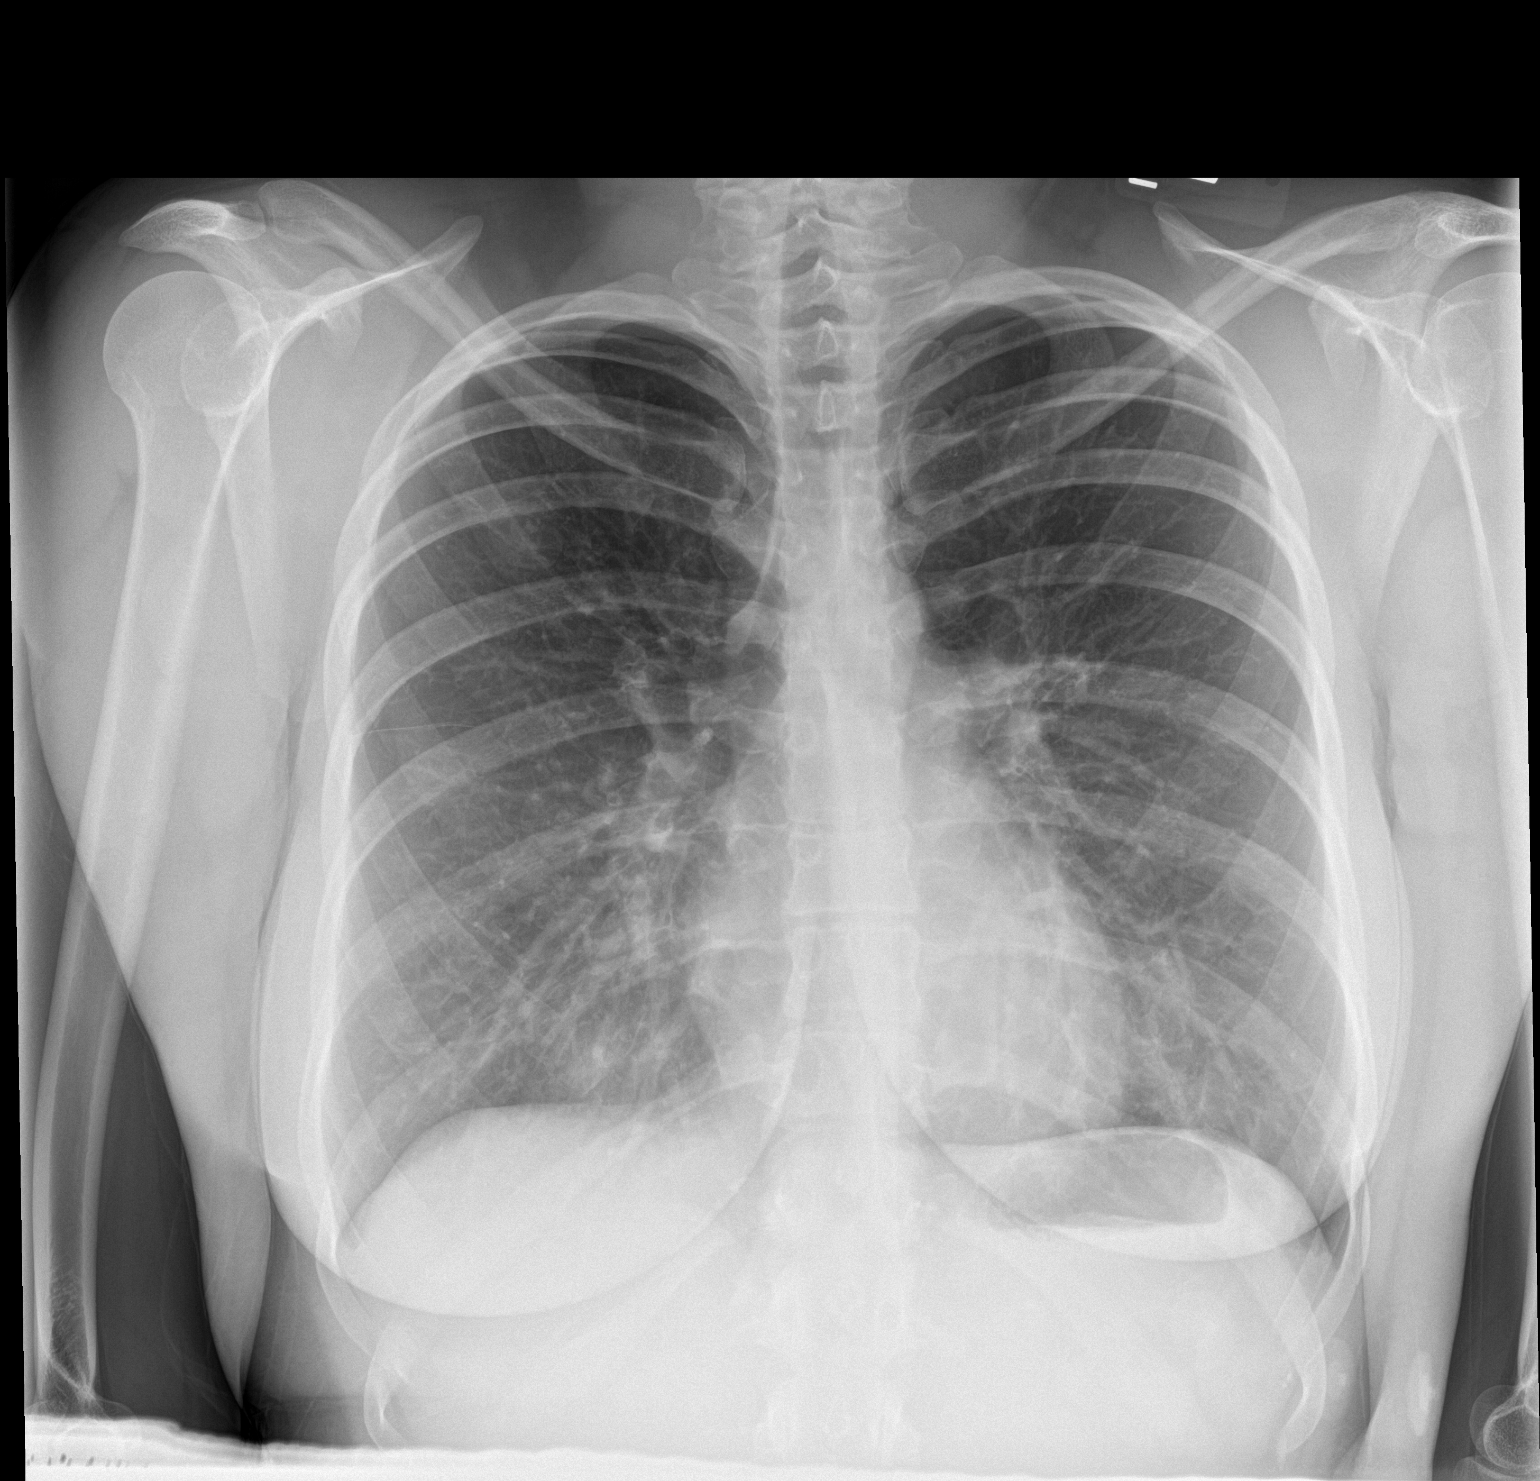

[chest lat]
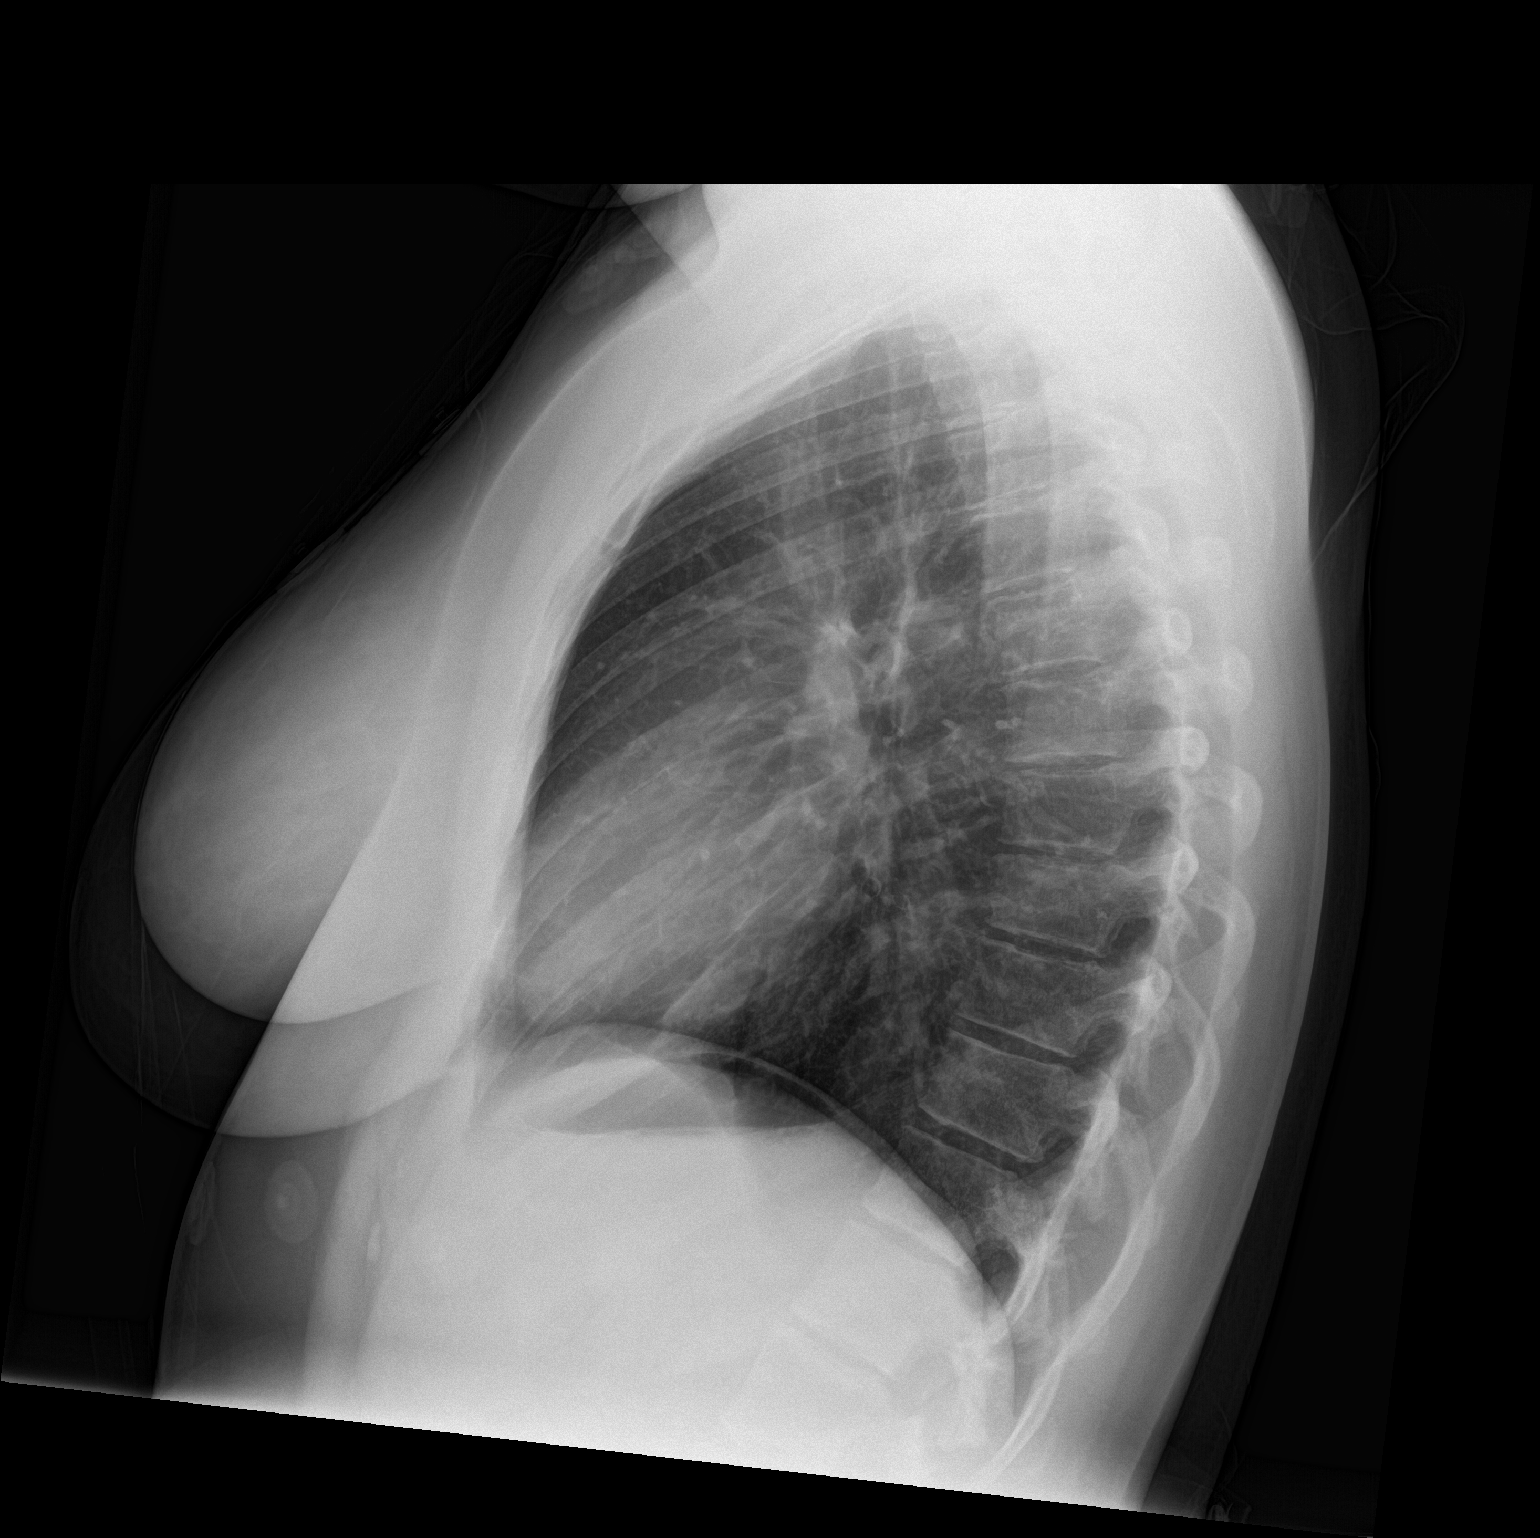

[2 of 2 positions shown; findings below may reference images not displayed]

FINDINGS: The heart size and mediastinal contours are within normal limits.
Both lungs are clear. The visualized skeletal structures are
unremarkable.
IMPRESSION: No active cardiopulmonary disease.

## 2017-05-24 ENCOUNTER — Encounter: Payer: Self-pay | Admitting: Certified Nurse Midwife

## 2017-05-24 ENCOUNTER — Other Ambulatory Visit (HOSPITAL_COMMUNITY)
Admission: RE | Admit: 2017-05-24 | Discharge: 2017-05-24 | Disposition: A | Discharge status: Home / Self Care | Payer: BLUE CROSS/BLUE SHIELD | Source: Ambulatory Visit | Attending: Certified Nurse Midwife | Admitting: Certified Nurse Midwife

## 2017-05-24 ENCOUNTER — Ambulatory Visit (INDEPENDENT_AMBULATORY_CARE_PROVIDER_SITE_OTHER): Payer: BLUE CROSS/BLUE SHIELD | Admitting: Certified Nurse Midwife

## 2017-05-24 VITALS — BP 138/84 | HR 112 | Ht 64.0 in | Wt 157.6 lb

## 2017-05-24 DIAGNOSIS — Z01419 Encounter for gynecological examination (general) (routine) without abnormal findings: Secondary | ICD-10-CM | POA: Insufficient documentation

## 2017-05-24 NOTE — Progress Notes (Signed)
Subjective:        Erica Pham is a 27 y.o. female here for a routine exam.  Current complaints: none, happy with OCPs.  Declines blood STD testing.   Personal health questionnaire:  Is patient Ashkenazi Jewish, have a family history of breast and/or ovarian cancer: no Is there a family history of uterine cancer diagnosed at age < 5750, gastrointestinal cancer, urinary tract cancer, family member who is a Personnel officerLynch syndrome-associated carrier: no Is the patient overweight and hypertensive, family history of diabetes, personal history of gestational diabetes, preeclampsia or PCOS: no Is patient over 2955, have PCOS,  family history of premature CHD under age 27, diabetes, smoke, have hypertension or peripheral artery disease:  no At any time, has a partner hit, kicked or otherwise hurt or frightened you?: no Over the past 2 weeks, have you felt down, depressed or hopeless?: no Over the past 2 weeks, have you felt little interest or pleasure in doing things?:not asked   Gynecologic History Patient's last menstrual period was 04/23/2017. Contraception: OCP (estrogen/progesterone) Last Pap: 05/28/15. Results were: normal Last mammogram: n/a <40 years, no significant family hx.  Obstetric History OB History  Gravida Para Term Preterm AB Living  1 1 1  0 0 1  SAB TAB Ectopic Multiple Live Births  0 0 0 0 1    # Outcome Date GA Lbr Len/2nd Weight Sex Delivery Anes PTL Lv  1 Term 01/05/16 3455w1d 06:32 / 00:15 7 lb 15.3 oz (3.61 kg) M Vag-Spont None  LIV    Past Medical History:  Diagnosis Date  . Medical history non-contributory     Past Surgical History:  Procedure Laterality Date  . NO PAST SURGERIES       Current Outpatient Medications:  .  norethindrone-ethinyl estradiol 1/35 (ORTHO-NOVUM 1/35, 28,) tablet, Take 1 tablet by mouth daily., Disp: 1 Package, Rfl: 11 .  acetaminophen-codeine (TYLENOL #3) 300-30 MG tablet, Take 1-2 tablets by mouth every 4 (four) hours as needed for  moderate pain. (Patient not taking: Reported on 04/26/2016), Disp: 30 tablet, Rfl: 0 .  ibuprofen (ADVIL,MOTRIN) 600 MG tablet, Take 1 tablet (600 mg total) by mouth every 6 (six) hours. (Patient not taking: Reported on 04/26/2016), Disp: 120 tablet, Rfl: 2 .  Prenat w/o A-FeCbn-DSS-FA-DHA (CITRANATAL HARMONY) 29-1-265 MG CAPS, Take 1 capsule by mouth at bedtime., Disp: , Rfl:  .  senna-docusate (SENOKOT-S) 8.6-50 MG tablet, Take 2 tablets by mouth 2 (two) times daily. (Patient not taking: Reported on 04/26/2016), Disp: 90 tablet, Rfl: 2 Allergies  Allergen Reactions  . Prednisone Shortness Of Breath    Social History   Tobacco Use  . Smoking status: Never Smoker  . Smokeless tobacco: Never Used  Substance Use Topics  . Alcohol use: Yes    Alcohol/week: 0.0 oz    Comment: occ.    Family History  Problem Relation Age of Onset  . Diabetes Maternal Grandfather       Review of Systems  Constitutional: negative for fatigue and weight loss Respiratory: negative for cough and wheezing Cardiovascular: negative for chest pain, fatigue and palpitations Gastrointestinal: negative for abdominal pain and change in bowel habits Musculoskeletal:negative for myalgias Neurological: negative for gait problems and tremors Behavioral/Psych: negative for abusive relationship, depression Endocrine: negative for temperature intolerance    Genitourinary:negative for abnormal menstrual periods, genital lesions, hot flashes, sexual problems and vaginal discharge Integument/breast: negative for breast lump, breast tenderness, nipple discharge and skin lesion(s)    Objective:  BP 138/84   Pulse (!) 112   Ht 5\' 4"  (1.626 m)   Wt 157 lb 9.6 oz (71.5 kg)   LMP 04/23/2017   BMI 27.05 kg/m  General:   alert  Skin:   no rash or abnormalities  Lungs:   clear to auscultation bilaterally  Heart:   regular rate and rhythm, S1, S2 normal, no murmur, click, rub or gallop  Breasts:   normal without  suspicious masses, skin or nipple changes or axillary nodes  Abdomen:  normal findings: no organomegaly, soft, non-tender and no hernia  Pelvis:  External genitalia: normal general appearance Urinary system: urethral meatus normal and bladder without fullness, nontender Vaginal: normal without tenderness, induration or masses Cervix: normal appearance Adnexa: normal bimanual exam Uterus: anteverted and non-tender, normal size   Lab Review Urine pregnancy test Labs reviewed yes Radiologic studies reviewed no  50% of 30 min visit spent on counseling and coordination of care.   Assessment & Plan    Healthy female exam.    1. Well woman exam    - Cytology - PAP - Cervicovaginal ancillary only   Education reviewed: calcium supplements, depression evaluation, low fat, low cholesterol diet, safe sex/STD prevention, self breast exams, skin cancer screening and weight bearing exercise. Contraception: OCP (estrogen/progesterone). Follow up in: 1 year.    Possible management options include:[facility]ARK Follow up as needed.

## 2017-05-25 ENCOUNTER — Encounter: Payer: Self-pay | Admitting: Certified Nurse Midwife

## 2017-05-25 LAB — CYTOLOGY - PAP
ADEQUACY: ABSENT
DIAGNOSIS: NEGATIVE

## 2017-05-25 LAB — CERVICOVAGINAL ANCILLARY ONLY
CHLAMYDIA, DNA PROBE: NEGATIVE
Neisseria Gonorrhea: NEGATIVE
Trichomonas: NEGATIVE

## 2017-05-26 DIAGNOSIS — L910 Hypertrophic scar: Secondary | ICD-10-CM | POA: Diagnosis not present

## 2017-05-29 ENCOUNTER — Other Ambulatory Visit: Payer: Self-pay | Admitting: Certified Nurse Midwife

## 2017-07-21 DIAGNOSIS — L910 Hypertrophic scar: Secondary | ICD-10-CM | POA: Diagnosis not present

## 2017-10-05 ENCOUNTER — Other Ambulatory Visit: Payer: Self-pay | Admitting: Obstetrics

## 2017-10-05 DIAGNOSIS — Z3041 Encounter for surveillance of contraceptive pills: Secondary | ICD-10-CM

## 2018-07-20 DIAGNOSIS — S161XXA Strain of muscle, fascia and tendon at neck level, initial encounter: Secondary | ICD-10-CM | POA: Diagnosis not present

## 2018-09-17 NOTE — Progress Notes (Signed)
GYNECOLOGY ANNUAL PREVENTATIVE CARE ENCOUNTER NOTE  History:     Erica Pham is a 28 y.o. 271P1001 female here for a routine annual gynecologic exam.  Current complaints: None.   Denies abnormal vaginal bleeding, discharge, pelvic pain, problems with intercourse or other gynecologic concerns.  Wants STD screening and refill of OCPs. Cycles are 28 days, light, no dysmenorrhea while on OCPs.    Gynecologic History LMP 09/11/18 Contraception: OCP (estrogen/progesterone) Last Pap: 4/19. Results were: Nml, but absent transformation zone Last mammogram: None.   Obstetric History OB History  Gravida Para Term Preterm AB Living  1 1 1  0 0 1  SAB TAB Ectopic Multiple Live Births  0 0 0 0 1    # Outcome Date GA Lbr Len/2nd Weight Sex Delivery Anes PTL Lv  1 Term 01/05/16 4886w1d 06:32 / 00:15 7 lb 15.3 oz (3.61 kg) M Vag-Spont None  LIV    Past Medical History:  Diagnosis Date  . Medical history non-contributory     Past Surgical History:  Procedure Laterality Date  . NO PAST SURGERIES      No current outpatient medications on file prior to visit.   No current facility-administered medications on file prior to visit.     Allergies  Allergen Reactions  . Prednisone Shortness Of Breath    Social History:  reports that she has never smoked. She has never used smokeless tobacco. She reports current alcohol use. She reports that she does not use drugs.  Family History  Problem Relation Age of Onset  . Diabetes Maternal Grandfather     The following portions of the patient's history were reviewed and updated as appropriate: allergies, current medications, past family history, past medical history, past social history, past surgical history and problem list.  Review of Systems Review of Systems  Constitutional: Negative for chills and fever.  Gastrointestinal: Negative for abdominal pain, nausea and vomiting.  Genitourinary: Negative for dyspareunia, dysuria, frequency,  hematuria, menstrual problem, pelvic pain, vaginal bleeding, vaginal discharge and vaginal pain.      Physical Exam:  BP 134/83   Pulse (!) 102   Ht 5\' 3"  (1.6 m)   Wt 128 lb 9.6 oz (58.3 kg)   BMI 22.78 kg/m  CONSTITUTIONAL: Well-developed, well-nourished female in no acute distress.  HENT:  Normocephalic, atraumatic, Oropharynx is clear and moist EYES: Conjunctivae normal. No scleral icterus.  NECK: Normal range of motion, supple, no masses.  Normal thyroid.  SKIN: Skin is warm and dry. No rash noted. Not diaphoretic. No erythema. No pallor. MUSCULOSKELETAL: Normal range of motion. No tenderness.  No cyanosis, clubbing, or edema.   NEUROLOGIC: Alert and oriented to person, place, and time. Normal muscle tone coordination.  PSYCHIATRIC: Normal mood and affect. Normal behavior. Normal judgment and thought content. CARDIOVASCULAR: Normal heart rate noted, regular rhythm RESPIRATORY: Clear to auscultation bilaterally. Effort and breath sounds normal, no problems with respiration noted. BREASTS: Symmetric in size. No masses, skin changes, nipple drainage, or lymphadenopathy. ABDOMEN: Soft, normal bowel sounds, no distention noted.  No tenderness, rebound or guarding.  PELVIC: Normal appearing external genitalia; normal appearing vaginal mucosa and cervix. Ectropion present.  No abnormal discharge noted.  Pap smear obtained. Scant spotting after Pap.  Normal uterine size, no other palpable masses, no uterine or adnexal tenderness.   Assessment and Plan:    1. Encounter for screening for cervical cancer   - Cytology - PAP( [place])  2. Encounter for annual physical examination excluding gynecological examination  in a patient older than 17 years  - Cytology - PAP( [place])  3. Encounter for surveillance of contraceptive pills  - norethindrone-ethinyl estradiol 1/35 (ALAYCEN 1/35) tablet; Take 1 tablet by mouth daily.  Dispense: 84 tablet; Refill: 3  4. Screen for sexually  transmitted diseases  - Cervicovaginal ancillary only - HIV Antibody (routine testing w rflx) - RPR - Hepatitis B Surface AntiGEN  5. Encounter for screening for HIV  - HIV Antibody (routine testing w rflx)   Will follow up results of pap smear and manage accordingly. Mammogram scheduled Routine preventative health maintenance measures emphasized. Please refer to After Visit Summary for other counseling recommendations.  F/U 1 year.  SBE taught      Manya Silvas, [place] [facility] [place]

## 2018-09-17 NOTE — Patient Instructions (Signed)
Preventive Care 21-28 Years Old, Female Preventive care refers to visits with your health care provider and lifestyle choices that can promote health and wellness. This includes:  A yearly physical exam. This may also be called an annual well check.  Regular dental visits and eye exams.  Immunizations.  Screening for certain conditions.  Healthy lifestyle choices, such as eating a healthy diet, getting regular exercise, not using drugs or products that contain nicotine and tobacco, and limiting alcohol use. What can I expect for my preventive care visit? Physical exam Your health care provider will check your:  Height and weight. This may be used to calculate body mass index (BMI), which tells if you are at a healthy weight.  Heart rate and blood pressure.  Skin for abnormal spots. Counseling Your health care provider may ask you questions about your:  Alcohol, tobacco, and drug use.  Emotional well-being.  Home and relationship well-being.  Sexual activity.  Eating habits.  Work and work environment.  Method of birth control.  Menstrual cycle.  Pregnancy history. What immunizations do I need?  Influenza (flu) vaccine  This is recommended every year. Tetanus, diphtheria, and pertussis (Tdap) vaccine  You may need a Td booster every 10 years. Varicella (chickenpox) vaccine  You may need this if you have not been vaccinated. Human papillomavirus (HPV) vaccine  If recommended by your health care provider, you may need three doses over 6 months. Measles, mumps, and rubella (MMR) vaccine  You may need at least one dose of MMR. You may also need a second dose. Meningococcal conjugate (MenACWY) vaccine  One dose is recommended if you are age 19-21 years and a first-year college student living in a residence hall, or if you have one of several medical conditions. You may also need additional booster doses. Pneumococcal conjugate (PCV13) vaccine  You may need  this if you have certain conditions and were not previously vaccinated. Pneumococcal polysaccharide (PPSV23) vaccine  You may need one or two doses if you smoke cigarettes or if you have certain conditions. Hepatitis A vaccine  You may need this if you have certain conditions or if you travel or work in places where you may be exposed to hepatitis A. Hepatitis B vaccine  You may need this if you have certain conditions or if you travel or work in places where you may be exposed to hepatitis B. Haemophilus influenzae type b (Hib) vaccine  You may need this if you have certain conditions. You may receive vaccines as individual doses or as more than one vaccine together in one shot (combination vaccines). Talk with your health care provider about the risks and benefits of combination vaccines. What tests do I need?  Blood tests  Lipid and cholesterol levels. These may be checked every 5 years starting at age 20.  Hepatitis C test.  Hepatitis B test. Screening  Diabetes screening. This is done by checking your blood sugar (glucose) after you have not eaten for a while (fasting).  Sexually transmitted disease (STD) testing.  BRCA-related cancer screening. This may be done if you have a family history of breast, ovarian, tubal, or peritoneal cancers.  Pelvic exam and Pap test. This may be done every 3 years starting at age 21. Starting at age 30, this may be done every 5 years if you have a Pap test in combination with an HPV test. Talk with your health care provider about your test results, treatment options, and if necessary, the need for more tests.   Follow these instructions at home: Eating and drinking   Eat a diet that includes fresh fruits and vegetables, whole grains, lean protein, and low-fat dairy.  Take vitamin and mineral supplements as recommended by your health care provider.  Do not drink alcohol if: ? Your health care provider tells you not to drink. ? You are  pregnant, may be pregnant, or are planning to become pregnant.  If you drink alcohol: ? Limit how much you have to 0-1 drink a day. ? Be aware of how much alcohol is in your drink. In the [place] one drink equals one 12 oz bottle of beer (355 mL), one 5 oz glass of wine (148 mL), or one 1 oz glass of hard liquor (44 mL). Lifestyle  Take daily care of your teeth and gums.  Stay active. Exercise for at least 30 minutes on 5 or more days each week.  Do not use any products that contain nicotine or tobacco, such as cigarettes, e-cigarettes, and chewing tobacco. If you need help quitting, ask your health care provider.  If you are sexually active, practice safe sex. Use a condom or other form of birth control (contraception) in order to prevent pregnancy and STIs (sexually transmitted infections). If you plan to become pregnant, see your health care provider for a preconception visit. What's next?  Visit your health care provider once a year for a well check visit.  Ask your health care provider how often you should have your eyes and teeth checked.  Stay up to date on all vaccines. This information is not intended to replace advice given to you by your health care provider. Make sure you discuss any questions you have with your health care provider. Document Released: 04/05/2001 Document Revised: 10/19/2017 Document Reviewed: 10/19/2017 Elsevier Patient Education  2020 [employer]  

## 2018-09-18 ENCOUNTER — Encounter: Payer: Self-pay | Admitting: Advanced Practice Midwife

## 2018-09-18 ENCOUNTER — Ambulatory Visit (INDEPENDENT_AMBULATORY_CARE_PROVIDER_SITE_OTHER): Payer: BC Managed Care – PPO | Admitting: Advanced Practice Midwife

## 2018-09-18 ENCOUNTER — Other Ambulatory Visit: Payer: Self-pay

## 2018-09-18 VITALS — BP 134/83 | HR 102 | Ht 63.0 in | Wt 128.6 lb

## 2018-09-18 DIAGNOSIS — Z114 Encounter for screening for human immunodeficiency virus [HIV]: Secondary | ICD-10-CM | POA: Diagnosis not present

## 2018-09-18 DIAGNOSIS — Z113 Encounter for screening for infections with a predominantly sexual mode of transmission: Secondary | ICD-10-CM

## 2018-09-18 DIAGNOSIS — Z124 Encounter for screening for malignant neoplasm of cervix: Secondary | ICD-10-CM | POA: Diagnosis not present

## 2018-09-18 DIAGNOSIS — Z0000 Encounter for general adult medical examination without abnormal findings: Secondary | ICD-10-CM

## 2018-09-18 DIAGNOSIS — Z01419 Encounter for gynecological examination (general) (routine) without abnormal findings: Secondary | ICD-10-CM

## 2018-09-18 DIAGNOSIS — Z3041 Encounter for surveillance of contraceptive pills: Secondary | ICD-10-CM

## 2018-09-18 MED ORDER — ALYACEN 1/35 1-35 MG-MCG PO TABS: 1.0000 | ORAL_TABLET | Freq: Every day | ORAL | 3 refills | Status: DC

## 2018-09-19 LAB — HIV ANTIBODY (ROUTINE TESTING W REFLEX): HIV Screen 4th Generation wRfx: NONREACTIVE

## 2018-09-19 LAB — HEPATITIS B SURFACE ANTIGEN: Hepatitis B Surface Ag: NEGATIVE

## 2018-09-19 LAB — RPR: RPR Ser Ql: NONREACTIVE

## 2018-09-20 LAB — CERVICOVAGINAL ANCILLARY ONLY
Chlamydia: NEGATIVE
Neisseria Gonorrhea: NEGATIVE

## 2018-09-20 LAB — CYTOLOGY - PAP: Diagnosis: NEGATIVE

## 2018-09-25 ENCOUNTER — Telehealth: Payer: Self-pay

## 2018-09-25 NOTE — Telephone Encounter (Signed)
Patient called and was concerned about not finding out her lab results from her visit 7 days ago. She has MyChart and she was told the results would be available on there for her to view in 3-4 days. I apologized to patient that her results had not been released to her yet and advised her that everything was normal. Pt verbalized relief and understanding. I advised her to call back with any further questions.

## 2019-03-18 DIAGNOSIS — M9902 Segmental and somatic dysfunction of thoracic region: Secondary | ICD-10-CM | POA: Diagnosis not present

## 2019-03-18 DIAGNOSIS — M9903 Segmental and somatic dysfunction of lumbar region: Secondary | ICD-10-CM | POA: Diagnosis not present

## 2019-03-18 DIAGNOSIS — M9901 Segmental and somatic dysfunction of cervical region: Secondary | ICD-10-CM | POA: Diagnosis not present

## 2019-03-18 DIAGNOSIS — M9905 Segmental and somatic dysfunction of pelvic region: Secondary | ICD-10-CM | POA: Diagnosis not present

## 2019-08-05 ENCOUNTER — Other Ambulatory Visit: Payer: Self-pay

## 2019-08-05 DIAGNOSIS — Z3041 Encounter for surveillance of contraceptive pills: Secondary | ICD-10-CM

## 2019-08-05 MED ORDER — ALYACEN 1/35 1-35 MG-MCG PO TABS: 1.0000 | ORAL_TABLET | Freq: Every day | ORAL | 0 refills | Status: DC

## 2019-08-05 MED FILL — ALYACEN 1-35-28 TABLET: 1-35 | 84 days supply | Qty: 84 | Fill #0

## 2019-09-27 ENCOUNTER — Ambulatory Visit: Payer: BC Managed Care – PPO | Admitting: Family Medicine

## 2019-12-30 ENCOUNTER — Encounter: Payer: Self-pay | Admitting: Family Medicine

## 2019-12-30 ENCOUNTER — Telehealth (INDEPENDENT_AMBULATORY_CARE_PROVIDER_SITE_OTHER): Payer: No Typology Code available for payment source | Admitting: Family Medicine

## 2019-12-30 ENCOUNTER — Other Ambulatory Visit: Payer: Self-pay | Admitting: Family Medicine

## 2019-12-30 DIAGNOSIS — J4520 Mild intermittent asthma, uncomplicated: Secondary | ICD-10-CM | POA: Diagnosis not present

## 2019-12-30 MED ORDER — ALBUTEROL SULFATE HFA 108 (90 BASE) MCG/ACT IN AERS: 2.0000 | INHALATION_SPRAY | Freq: Four times a day (QID) | RESPIRATORY_TRACT | 2 refills | Status: DC | PRN

## 2019-12-30 MED ORDER — AEROCHAMBER PLUS MISC
0 refills | Status: DC
Start: 2019-12-30 — End: 2020-08-07

## 2019-12-30 MED ORDER — MONTELUKAST SODIUM 10 MG PO TABS: 10.0000 mg | ORAL_TABLET | Freq: Every day | ORAL | 1 refills | Status: DC

## 2019-12-30 MED FILL — MONTELUKAST SOD 10 MG TAB: 10 | 90 days supply | Qty: 90 | Fill #0

## 2019-12-30 MED FILL — ALBUTEROL SULFATE HFA 108 (: 108 (90 BAS | 25 days supply | Qty: 18 | Fill #0

## 2019-12-30 NOTE — Progress Notes (Signed)
Erica Pham DOB: October 24, 1990 Encounter date: 12/30/2019  This is a 29 y.o. female who presents to establish care. Chief Complaint  Patient presents with   Establish Care    History of present illness: Usually doesn't do a yearly primary visit, but now that she has [employer] insurance she has to have yearly physicals. 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   Never dx with asthma; just more asthma-like sx. Was prescribed inhaler just if she has issues but hasn't had refill because she hasn't been to doctor. On and off. Thinks that allergies trigger some of the random night time cough/breathing difficulty/wheezing. Through bad season usually using inhaler once or twice daily - morning or before bed. Sometimes up in middle of night. Usually October to February is worse for her. Albuterol inhaler is only one that she has used. Has used son's inhalers - albuterol. Was on singulair in past, but thinks made her sleepy.    Past Medical History:  Diagnosis Date   Medical history non-contributory    Past Surgical History:  Procedure Laterality Date   NO PAST SURGERIES     Allergies  Allergen Reactions   Prednisone Shortness Of Breath   Current Meds  Medication Sig   norethindrone-ethinyl estradiol 1/35 (ALAYCEN 1/35) tablet Take 1 tablet by mouth daily.   Social History   Tobacco Use   Smoking status: Never Smoker   Smokeless tobacco: Never Used  Substance Use Topics   Alcohol use: Yes    Alcohol/week: 0.0 standard drinks    Comment: occ.   Family History  Problem Relation Age of Onset   Diabetes Maternal Grandfather    Prostate cancer Maternal Grandfather    Menstrual problems Mother        hysterectomy for menorrhagia   Other Father        was murdered     Review of Systems  Constitutional: Negative for chills, fatigue and fever.  Respiratory: Positive for shortness of breath and wheezing. Negative for cough and chest tightness.   Cardiovascular: Negative for chest pain,  palpitations and leg swelling.    Objective:  LMP 12/29/2019 (Exact Date)       BP Readings from Last 3 Encounters:  09/18/18 134/83  05/24/17 138/84  04/26/16 116/82   Wt Readings from Last 3 Encounters:  09/18/18 128 lb 9.6 oz (58.3 kg)  05/24/17 157 lb 9.6 oz (71.5 kg)  04/26/16 173 lb 4.8 oz (78.6 kg)    Physical Exam Constitutional:      General: She is not in acute distress.    Appearance: She is well-developed.  Cardiovascular:     Rate and Rhythm: Normal rate and regular rhythm.     Heart sounds: Normal heart sounds. No murmur heard.   No friction rub.  Pulmonary:     Effort: Pulmonary effort is normal. No respiratory distress.     Breath sounds: Normal breath sounds. No wheezing or rales.  Musculoskeletal:     Right lower leg: No edema.     Left lower leg: No edema.  Neurological:     Mental Status: She is alert and oriented to person, place, and time.  Psychiatric:        Behavior: Behavior normal.    Assessment/Plan: 1. Mild intermittent asthma, unspecified whether complicated Albuterol inhaler for prn use. Singulair for allergy and hopefully asthma control.    Return in about 6 months (around 06/28/2020) for physical exam.  Theodis Shove, MD

## 2020-04-22 MED FILL — ALBUTEROL SULFATE HFA 108 (: 108 (90 BAS | 25 days supply | Qty: 18 | Fill #1

## 2020-07-10 ENCOUNTER — Ambulatory Visit: Payer: Self-pay | Admitting: Family Medicine

## 2020-07-23 ENCOUNTER — Other Ambulatory Visit (HOSPITAL_COMMUNITY): Payer: Self-pay

## 2020-07-23 MED FILL — Albuterol Sulfate Inhal Aero 108 MCG/ACT (90MCG Base Equiv): RESPIRATORY_TRACT | 25 days supply | Qty: 18 | Fill #0 | Status: AC

## 2020-08-06 ENCOUNTER — Other Ambulatory Visit: Payer: Self-pay

## 2020-08-07 ENCOUNTER — Encounter: Payer: Self-pay | Admitting: Family Medicine

## 2020-08-07 ENCOUNTER — Other Ambulatory Visit (HOSPITAL_COMMUNITY): Payer: Self-pay

## 2020-08-07 ENCOUNTER — Ambulatory Visit (INDEPENDENT_AMBULATORY_CARE_PROVIDER_SITE_OTHER): Payer: No Typology Code available for payment source | Admitting: Family Medicine

## 2020-08-07 VITALS — BP 98/68 | HR 82 | Temp 98.4°F | Ht 63.0 in | Wt 151.4 lb

## 2020-08-07 DIAGNOSIS — J302 Other seasonal allergic rhinitis: Secondary | ICD-10-CM | POA: Diagnosis not present

## 2020-08-07 DIAGNOSIS — J4540 Moderate persistent asthma, uncomplicated: Secondary | ICD-10-CM | POA: Diagnosis not present

## 2020-08-07 DIAGNOSIS — Z13220 Encounter for screening for lipoid disorders: Secondary | ICD-10-CM | POA: Diagnosis not present

## 2020-08-07 DIAGNOSIS — J45909 Unspecified asthma, uncomplicated: Secondary | ICD-10-CM | POA: Insufficient documentation

## 2020-08-07 DIAGNOSIS — Z131 Encounter for screening for diabetes mellitus: Secondary | ICD-10-CM

## 2020-08-07 DIAGNOSIS — Z0000 Encounter for general adult medical examination without abnormal findings: Secondary | ICD-10-CM | POA: Diagnosis not present

## 2020-08-07 DIAGNOSIS — J4520 Mild intermittent asthma, uncomplicated: Secondary | ICD-10-CM

## 2020-08-07 MED ORDER — MONTELUKAST SODIUM 10 MG PO TABS
ORAL_TABLET | Freq: Every day | ORAL | 1 refills | Status: DC
  Filled 2020-08-07 – 2020-09-10 (×2): qty 90, 90d supply, fill #0
  Filled 2021-02-19: qty 90, 90d supply, fill #1

## 2020-08-07 MED ORDER — FLUTICASONE PROPIONATE 50 MCG/ACT NA SUSP
2.0000 | Freq: Every day | NASAL | 6 refills | Status: DC
  Filled 2020-08-07 – 2020-08-21 (×2): qty 16, 30d supply, fill #0

## 2020-08-07 MED ORDER — BUDESONIDE-FORMOTEROL FUMARATE 80-4.5 MCG/ACT IN AERO
2.0000 | INHALATION_SPRAY | Freq: Two times a day (BID) | RESPIRATORY_TRACT | 12 refills | Status: DC
  Filled 2020-08-07 – 2020-08-21 (×2): qty 10.2, 30d supply, fill #0

## 2020-08-07 MED ORDER — ALBUTEROL SULFATE HFA 108 (90 BASE) MCG/ACT IN AERS
INHALATION_SPRAY | RESPIRATORY_TRACT | 2 refills | Status: DC
  Filled 2020-08-07 – 2020-11-19 (×2): qty 18, 25d supply, fill #0
  Filled 2021-01-31: qty 18, 25d supply, fill #1

## 2020-08-07 NOTE — Progress Notes (Signed)
Erica Pham DOB: 26-May-1990 Encounter date: 08/07/2020  This is a 30 y.o. female who presents for complete physical   History of present illness/Additional concerns:  Asthma: using inhaler a little more in heat - seems to make breathing a little worse - probably using twice daily. Does help when she uses this. Does feel like she is still getting Wheezy; no cough.   Allergies: have been bad. She is willing to restart flonase which has helped in the past.   Follows with gyn regularly. Periods are regular. She stopped. Periods last 5 days now. Using condoms for birth control. First day light; second day heavy then light again. Changes at most every 3 hours. Lighter than it was prior to having baby.   Past Medical History:  Diagnosis Date   Medical history non-contributory    Past Surgical History:  Procedure Laterality Date   NO PAST SURGERIES     Allergies  Allergen Reactions   Prednisone Shortness Of Breath   Current Meds  Medication Sig   budesonide-formoterol (SYMBICORT) 80-4.5 MCG/ACT inhaler Inhale 2 puffs into the lungs 2 (two) times daily.   fluticasone (FLONASE) 50 MCG/ACT nasal spray Place 2 sprays into both nostrils daily.   [DISCONTINUED] albuterol (VENTOLIN HFA) 108 (90 Base) MCG/ACT inhaler INHALE 2 PUFFS BY MOUTH INTO LUNGS EVERY 6 HOURS AS NEEDED FOR WHEEZING OR SHORTNESS OF BREATH   [DISCONTINUED] montelukast (SINGULAIR) 10 MG tablet TAKE 1 TABLET BY MOUTH AT BEDTIME   [DISCONTINUED] Spacer/Aero-Holding Chambers (AEROCHAMBER PLUS) inhaler Use as instructed   Social History   Tobacco Use   Smoking status: Never   Smokeless tobacco: Never  Substance Use Topics   Alcohol use: Yes    Alcohol/week: 0.0 standard drinks    Comment: occ.   Family History  Problem Relation Age of Onset   Diabetes Maternal Grandfather    Prostate cancer Maternal Grandfather    Menstrual problems Mother        hysterectomy for menorrhagia   Other Father        was murdered      Review of Systems  Constitutional:  Negative for activity change, appetite change, chills, fatigue, fever and unexpected weight change.  HENT:  Negative for congestion, ear pain, hearing loss, sinus pressure, sinus pain, sore throat and trouble swallowing.   Eyes:  Negative for pain and visual disturbance.  Respiratory:  Negative for cough, chest tightness, shortness of breath and wheezing.   Cardiovascular:  Negative for chest pain, palpitations and leg swelling.  Gastrointestinal:  Negative for abdominal pain, blood in stool, constipation, diarrhea, nausea and vomiting.  Genitourinary:  Negative for difficulty urinating and menstrual problem.  Musculoskeletal:  Negative for arthralgias and back pain.  Skin:  Negative for rash.  Neurological:  Negative for dizziness, weakness, numbness and headaches.  Hematological:  Negative for adenopathy. Does not bruise/bleed easily.  Psychiatric/Behavioral:  Negative for sleep disturbance and suicidal ideas. The patient is not nervous/anxious.    CBC:  Lab Results  Component Value Date   WBC 11.0 (H) 01/04/2016   HGB 12.2 01/04/2016   HGB 12.0 10/16/2015   HCT 36.5 01/04/2016   HCT 36.2 10/16/2015   MCH 30.3 01/04/2016   MCHC 33.4 01/04/2016   RDW 14.3 01/04/2016   RDW 14.1 10/16/2015   PLT 167 01/04/2016   PLT 212 10/16/2015   CMP: Lab Results  Component Value Date   NA 137 05/14/2015   K 3.5 05/14/2015   CL 106 05/14/2015   CO2 20 (  L) 05/14/2015   ANIONGAP 11 05/14/2015   GLUCOSE 89 05/14/2015   BUN <5 (L) 05/14/2015   CREATININE 0.82 05/14/2015   GFRAA >60 05/14/2015   CALCIUM 9.3 05/14/2015   LIPID:No results found for: CHOL, TRIG, HDL, LDLCALC, LABVLDL  Objective:  BP 98/68 (BP Location: Left Arm, Patient Position: Sitting, Cuff Size: Normal)   Pulse 82   Temp 98.4 F (36.9 C) (Oral)   Ht 5\' 3"  (1.6 m)   Wt 151 lb 6.4 oz (68.7 kg)   LMP 07/20/2020 (Exact Date)   BMI 26.82 kg/m   Weight: 151 lb 6.4 oz (68.7 kg)    BP Readings from Last 3 Encounters:  08/07/20 98/68  09/18/18 134/83  05/24/17 138/84   Wt Readings from Last 3 Encounters:  08/07/20 151 lb 6.4 oz (68.7 kg)  09/18/18 128 lb 9.6 oz (58.3 kg)  05/24/17 157 lb 9.6 oz (71.5 kg)    Physical Exam Constitutional:      General: She is not in acute distress.    Appearance: She is well-developed.  HENT:     Head: Normocephalic and atraumatic.     Right Ear: External ear normal.     Left Ear: External ear normal.     Mouth/Throat:     Pharynx: No oropharyngeal exudate.  Eyes:     Conjunctiva/sclera: Conjunctivae normal.     Pupils: Pupils are equal, round, and reactive to light.  Neck:     Thyroid: No thyromegaly.  Cardiovascular:     Rate and Rhythm: Normal rate and regular rhythm.     Heart sounds: Normal heart sounds. No murmur heard.   No friction rub. No gallop.  Pulmonary:     Effort: Pulmonary effort is normal.     Breath sounds: Normal breath sounds.  Abdominal:     General: Bowel sounds are normal. There is no distension.     Palpations: Abdomen is soft. There is no mass.     Tenderness: There is no abdominal tenderness. There is no guarding.     Hernia: No hernia is present.  Musculoskeletal:        General: No tenderness or deformity. Normal range of motion.     Cervical back: Normal range of motion and neck supple.  Lymphadenopathy:     Cervical: No cervical adenopathy.  Skin:    General: Skin is warm and dry.     Findings: No rash.  Neurological:     Mental Status: She is alert and oriented to person, place, and time.     Deep Tendon Reflexes: Reflexes normal.     Reflex Scores:      Tricep reflexes are 2+ on the right side and 2+ on the left side.      Bicep reflexes are 2+ on the right side and 2+ on the left side.      Brachioradialis reflexes are 2+ on the right side and 2+ on the left side.      Patellar reflexes are 2+ on the right side and 2+ on the left side. Psychiatric:        Speech: Speech  normal.        Behavior: Behavior normal.        Thought Content: Thought content normal.    Assessment/Plan: There are no preventive care reminders to display for this patient. Health Maintenance reviewed - bloodwork ordered to complete at her convenience.  1. Preventative health care We discussed getting back on regular exercise routine.  -  CBC with Differential/Platelet; Future  2. Moderate persistent asthma, unspecified whether complicated Albuterol use daily; will start symbicort and flonase to hopefully help with asthma symptoms.  - budesonide-formoterol (SYMBICORT) 80-4.5 MCG/ACT inhaler; Inhale 2 puffs into the lungs 2 (two) times daily.  Dispense: 1 each; Refill: 12 - fluticasone (FLONASE) 50 MCG/ACT nasal spray; Place 2 sprays into both nostrils daily.  Dispense: 16 g; Refill: 6  3. Seasonal allergies Adding flonase, continue singulair.  - fluticasone (FLONASE) 50 MCG/ACT nasal spray; Place 2 sprays into both nostrils daily.  Dispense: 16 g; Refill: 6  4. Lipid screening - Lipid panel; Future  5. Screening for diabetes mellitus - Comprehensive metabolic panel; Future   *if you are able to find old bloodwork with cholesterol checked on it; let me know. Otherwise you can get bloodwork done (please call for appointment) at your convenience. You don't have to fast for long period of time, but I would suggest a few hours.   *start with 1 puff symbicort twice daily. Make sure that you tolerate this (from heart rate standpoint). You can increase if tolerating to 2 puffs twice daily. This should eliminate the need (for the most part) for albuterol inhaler. You can use your spacing chamber with the symbicort inhaler.   *if you are still needing albuterol more than 2 times/week let me know and I will see you sooner than 6 months time.   Return in about 6 months (around 02/06/2021) for Chronic condition visit.  Theodis Shove, MD

## 2020-08-07 NOTE — Patient Instructions (Signed)
*  if you are able to find old bloodwork with cholesterol checked on it; let me know. Otherwise you can get bloodwork done (please call for appointment) at your convenience. You don't have to fast for long period of time, but I would suggest a few hours.   *start with 1 puff symbicort twice daily. Make sure that you tolerate this (from heart rate standpoint). You can increase if tolerating to 2 puffs twice daily. This should eliminate the need (for the most part) for albuterol inhaler. You can use your spacing chamber with the symbicort inhaler.   *if you are still needing albuterol more than 2 times/week let me know and I will see you sooner than 6 months time.

## 2020-08-18 ENCOUNTER — Other Ambulatory Visit (HOSPITAL_COMMUNITY): Payer: Self-pay

## 2020-08-21 ENCOUNTER — Other Ambulatory Visit (HOSPITAL_COMMUNITY): Payer: Self-pay

## 2020-09-10 ENCOUNTER — Other Ambulatory Visit (HOSPITAL_COMMUNITY): Payer: Self-pay

## 2020-11-19 ENCOUNTER — Other Ambulatory Visit (HOSPITAL_COMMUNITY): Payer: Self-pay

## 2020-11-19 MED ORDER — CHLORHEXIDINE GLUCONATE 0.12 % MT SOLN
OROMUCOSAL | 0 refills | Status: DC
  Filled 2020-11-19: qty 473, 7d supply, fill #0

## 2020-11-19 MED ORDER — METHYLPREDNISOLONE 4 MG PO TBPK
ORAL_TABLET | ORAL | 0 refills | Status: DC
  Filled 2020-11-19: qty 21, 6d supply, fill #0

## 2020-11-19 MED ORDER — IBUPROFEN 600 MG PO TABS
ORAL_TABLET | ORAL | 0 refills | Status: DC
  Filled 2020-11-19: qty 30, 8d supply, fill #0

## 2020-11-19 MED ORDER — PENICILLIN V POTASSIUM 500 MG PO TABS
ORAL_TABLET | ORAL | 0 refills | Status: DC
  Filled 2020-11-19: qty 30, 8d supply, fill #0

## 2020-11-25 ENCOUNTER — Other Ambulatory Visit (HOSPITAL_COMMUNITY): Payer: Self-pay

## 2021-02-01 ENCOUNTER — Other Ambulatory Visit (HOSPITAL_COMMUNITY): Payer: Self-pay

## 2021-02-08 ENCOUNTER — Ambulatory Visit: Payer: No Typology Code available for payment source | Admitting: Family Medicine

## 2021-02-19 ENCOUNTER — Other Ambulatory Visit (HOSPITAL_COMMUNITY): Payer: Self-pay

## 2021-02-26 ENCOUNTER — Other Ambulatory Visit (HOSPITAL_COMMUNITY): Payer: Self-pay

## 2021-04-23 ENCOUNTER — Ambulatory Visit: Payer: No Typology Code available for payment source | Admitting: Family Medicine

## 2021-05-05 ENCOUNTER — Ambulatory Visit (INDEPENDENT_AMBULATORY_CARE_PROVIDER_SITE_OTHER): Payer: No Typology Code available for payment source | Admitting: Family Medicine

## 2021-05-05 ENCOUNTER — Encounter: Payer: Self-pay | Admitting: Family Medicine

## 2021-05-05 ENCOUNTER — Other Ambulatory Visit (HOSPITAL_COMMUNITY): Payer: Self-pay

## 2021-05-05 VITALS — BP 122/80 | HR 77 | Temp 98.0°F | Ht 63.0 in | Wt 157.0 lb

## 2021-05-05 DIAGNOSIS — J4540 Moderate persistent asthma, uncomplicated: Secondary | ICD-10-CM

## 2021-05-05 DIAGNOSIS — J302 Other seasonal allergic rhinitis: Secondary | ICD-10-CM

## 2021-05-05 DIAGNOSIS — J4520 Mild intermittent asthma, uncomplicated: Secondary | ICD-10-CM

## 2021-05-05 MED ORDER — ALBUTEROL SULFATE HFA 108 (90 BASE) MCG/ACT IN AERS
INHALATION_SPRAY | RESPIRATORY_TRACT | 5 refills | Status: DC
  Filled 2021-05-05 – 2021-05-17 (×2): qty 18, 25d supply, fill #0

## 2021-05-05 MED ORDER — MONTELUKAST SODIUM 10 MG PO TABS
ORAL_TABLET | Freq: Every day | ORAL | 2 refills | Status: DC
  Filled 2021-05-05 – 2021-06-21 (×2): qty 90, 90d supply, fill #0

## 2021-05-05 MED ORDER — BUDESONIDE-FORMOTEROL FUMARATE 160-4.5 MCG/ACT IN AERO
INHALATION_SPRAY | RESPIRATORY_TRACT | 3 refills | Status: DC
Start: 2021-05-05 — End: 2022-06-29
  Filled 2021-05-05 – 2021-05-17 (×2): qty 10.2, 30d supply, fill #0
  Filled 2021-10-04: qty 10.2, 30d supply, fill #1
  Filled 2022-02-08: qty 10.2, 30d supply, fill #2

## 2021-05-05 MED ORDER — FLUTICASONE PROPIONATE 50 MCG/ACT NA SUSP
2.0000 | Freq: Every day | NASAL | 6 refills | Status: DC
  Filled 2021-05-05 – 2021-10-04 (×2): qty 16, 30d supply, fill #0

## 2021-05-05 NOTE — Progress Notes (Signed)
?Erica Pham ?DOB: [birth year] ?Encounter date: 05/05/2021 ? ?This is a 31 y.o. female who presents with follow up chronic conditions ? ?History of present illness: ?Last visit was 08/07/2020 for complete physical. ? ?Asthma/allergies: At last visit we started Symbicort as well as Flonase to help with asthma symptoms as well as allergy control.  We added Singulair as well. ? ?Symbicort was $60 (normally albuterol is $25), flonase has helped and singulair have helped.  ? ?Lately in last couple of weeks has not needed daily albuterol, but otherwise usually needing daily.  ? ?Flonase helps more with congestion. No illness or flares in asthma since last visit. Since she has had albuterol available she has been fine.  ? ?Allergies  ?Allergen Reactions  ? Prednisone Shortness Of Breath  ? Bee Venom Swelling  ? ?Current Meds  ?Medication Sig  ? budesonide-formoterol (SYMBICORT) 160-4.5 MCG/ACT inhaler Inhale 1-2 puffs by mouth twice daily as needed  ? [DISCONTINUED] albuterol (VENTOLIN HFA) 108 (90 Base) MCG/ACT inhaler INHALE 2 PUFFS BY MOUTH INTO LUNGS EVERY 6 HOURS AS NEEDED FOR WHEEZING OR SHORTNESS OF BREATH  ? [DISCONTINUED] fluticasone (FLONASE) 50 MCG/ACT nasal spray Place 2 sprays into both nostrils daily.  ? [DISCONTINUED] methylPREDNISolone (MEDROL DOSEPAK) 4 MG TBPK tablet Use as directed on package  ? [DISCONTINUED] montelukast (SINGULAIR) 10 MG tablet TAKE 1 TABLET BY MOUTH AT BEDTIME  ? ? ?Review of Systems  ?Constitutional:  Negative for chills, fatigue and fever.  ?Respiratory:  Negative for cough, chest tightness, shortness of breath (albuterol helps; see hpi) and wheezing.   ?Cardiovascular:  Negative for chest pain, palpitations and leg swelling.  ? ?Objective: ? ?BP 122/80 (BP Location: Left Arm, Patient Position: Sitting, Cuff Size: Normal)   Pulse 77   Temp 98 ?F (36.7 ?C) (Oral)   Ht 5\' 3"  (1.6 m)   Wt 157 lb (71.2 kg)   LMP 05/02/2021 (Exact Date)   SpO2 98%   BMI 27.81 kg/m?   Weight:  157 lb (71.2 kg)  ? ?BP Readings from Last 3 Encounters:  ?05/05/21 122/80  ?08/07/20 98/68  ?09/18/18 134/83  ? ?Wt Readings from Last 3 Encounters:  ?05/05/21 157 lb (71.2 kg)  ?08/07/20 151 lb 6.4 oz (68.7 kg)  ?09/18/18 128 lb 9.6 oz (58.3 kg)  ? ? ?Physical Exam ?Constitutional:   ?   General: She is not in acute distress. ?   Appearance: She is well-developed.  ?Cardiovascular:  ?   Rate and Rhythm: Normal rate and regular rhythm.  ?   Heart sounds: Normal heart sounds. No murmur heard. ?  No friction rub.  ?Pulmonary:  ?   Effort: Pulmonary effort is normal. No respiratory distress.  ?   Breath sounds: Normal breath sounds. No wheezing or rales.  ?Musculoskeletal:  ?   Right lower leg: No edema.  ?   Left lower leg: No edema.  ?Neurological:  ?   Mental Status: She is alert and oriented to person, place, and time.  ?Psychiatric:     ?   Behavior: Behavior normal.  ? ? ?Assessment/Plan ? ?1. Moderate persistent asthma, unspecified whether complicated ?She is going to add this; rinse mouth after use. Use to limit albuterol to less than twice a week. ?- budesonide-formoterol (SYMBICORT) 160-4.5 MCG/ACT inhaler; Inhale 1-2 puffs by mouth twice daily as needed  Dispense: 10.2 g; Refill: 3 ? ?2. Seasonal allergies ?Improved with flonase, singulair.  ? ?Return in about 6 months (around 11/05/2021). ? ? ? ? ?11/07/2021,  MD ?

## 2021-05-05 NOTE — Patient Instructions (Addendum)
Try the symbicort - 1 puff daily is fine to start. Make sure to rinse your mouth after use. Our goal is to decrease your albuterol use to less than twice a week.  ? ? ?

## 2021-05-13 ENCOUNTER — Other Ambulatory Visit (HOSPITAL_COMMUNITY): Payer: Self-pay

## 2021-05-17 ENCOUNTER — Other Ambulatory Visit (HOSPITAL_COMMUNITY): Payer: Self-pay

## 2021-06-21 ENCOUNTER — Other Ambulatory Visit (HOSPITAL_COMMUNITY): Payer: Self-pay

## 2021-10-04 ENCOUNTER — Other Ambulatory Visit (HOSPITAL_COMMUNITY): Payer: Self-pay

## 2022-02-08 ENCOUNTER — Other Ambulatory Visit: Payer: Self-pay

## 2022-06-29 ENCOUNTER — Other Ambulatory Visit (HOSPITAL_COMMUNITY): Payer: Self-pay

## 2022-06-29 ENCOUNTER — Other Ambulatory Visit: Payer: Self-pay

## 2022-06-29 ENCOUNTER — Telehealth: Payer: Self-pay | Admitting: Family Medicine

## 2022-06-29 DIAGNOSIS — J4540 Moderate persistent asthma, uncomplicated: Secondary | ICD-10-CM

## 2022-06-29 MED ORDER — BUDESONIDE-FORMOTEROL FUMARATE 160-4.5 MCG/ACT IN AERO
INHALATION_SPRAY | RESPIRATORY_TRACT | 0 refills | Status: DC
  Filled 2022-06-29 (×2): qty 10.2, 30d supply, fill #0

## 2022-06-29 NOTE — Telephone Encounter (Signed)
Prescription Request  06/29/2022  LOV: Visit date not found  What is the name of the medication or equipment? budesonide-formoterol (SYMBICORT) 160-4.5 MCG/ACT inhaler . Pt has an appt on 08-03-2022  Have you contacted your pharmacy to request a refill? No   Which pharmacy would you like this sent to?  [facility] [address] [place] [address] Phone: [phone] Fax: [phone]    Patient notified that their request is being sent to the clinical staff for review and that they should receive a response within 2 business days.   Please advise at Mobile [phone] (mobile)

## 2022-06-29 NOTE — Telephone Encounter (Signed)
Rx done. 

## 2022-06-29 NOTE — Telephone Encounter (Signed)
Hasn't been seen in >1 year, last saw Dr. Hassan Rowan in March 2023, needs an appointment, then ok to refill until her appointment time.

## 2022-08-03 ENCOUNTER — Encounter: Payer: 59 | Admitting: Family Medicine

## 2022-09-06 ENCOUNTER — Ambulatory Visit (INDEPENDENT_AMBULATORY_CARE_PROVIDER_SITE_OTHER): Payer: 59 | Admitting: Family Medicine

## 2022-09-06 ENCOUNTER — Encounter: Payer: Self-pay | Admitting: Family Medicine

## 2022-09-06 ENCOUNTER — Other Ambulatory Visit (HOSPITAL_COMMUNITY): Payer: Self-pay

## 2022-09-06 VITALS — BP 102/76 | HR 90 | Temp 98.9°F | Ht 63.0 in | Wt 164.1 lb

## 2022-09-06 DIAGNOSIS — J4540 Moderate persistent asthma, uncomplicated: Secondary | ICD-10-CM

## 2022-09-06 DIAGNOSIS — Z0000 Encounter for general adult medical examination without abnormal findings: Secondary | ICD-10-CM

## 2022-09-06 MED ORDER — MONTELUKAST SODIUM 10 MG PO TABS
10.0000 mg | ORAL_TABLET | Freq: Every day | ORAL | 1 refills | Status: DC
Start: 2022-09-06 — End: 2023-09-25
  Filled 2022-09-06 – 2023-06-27 (×2): qty 90, 90d supply, fill #0

## 2022-09-06 MED ORDER — BUDESONIDE-FORMOTEROL FUMARATE 160-4.5 MCG/ACT IN AERO
1.0000 | INHALATION_SPRAY | Freq: Two times a day (BID) | RESPIRATORY_TRACT | 5 refills | Status: DC | PRN
  Filled 2022-09-06 – 2022-11-02 (×2): qty 10.2, 30d supply, fill #0
  Filled 2023-03-02: qty 10.2, 30d supply, fill #1
  Filled 2023-07-20: qty 10.2, 30d supply, fill #2

## 2022-09-06 NOTE — Progress Notes (Signed)
Complete physical exam  Patient: Erica Pham   DOB: 06-09-1990   31 y.o. Female  MRN: [id number]  Subjective:    Chief Complaint  Patient presents with   Establish Care    Erica Pham is a 32 y.o. female who presents today for a complete physical exam. She reports consuming a  general except no red meat  diet. Gym/ health club routine includes cardio three times a week. She generally feels well. She reports sleeping well. She does not have additional problems to discuss today.    Most recent fall risk assessment:     No data to display           Most recent depression screenings:    09/06/2022    8:08 AM 05/05/2021   10:35 AM  PHQ 2/9 Scores  PHQ - 2 Score 0 1  PHQ- 9 Score 0 1    Dental: No current dental problems and Receives regular dental care  Patient Active Problem List   Diagnosis Date Noted   Moderate asthma 08/07/2020   Seasonal allergies 08/07/2020      Patient Care Team: Karie Georges, MD as PCP - General (Family Medicine) Ob/Gyn, [facility] (Obstetrics and Gynecology)   Outpatient Medications Prior to Visit  Medication Sig   fluticasone (FLONASE) 50 MCG/ACT nasal spray Use 2 sprays into both nostrils daily.   [DISCONTINUED] budesonide-formoterol (SYMBICORT) 160-4.5 MCG/ACT inhaler Inhale 1-2 puffs by mouth twice daily as needed (Patient taking differently: 1 puff daily. Inhale 1-2 puffs by mouth twice daily as needed)   [DISCONTINUED] albuterol (VENTOLIN HFA) 108 (90 Base) MCG/ACT inhaler INHALE 2 PUFFS BY MOUTH INTO LUNGS EVERY 6 HOURS AS NEEDED FOR WHEEZING OR SHORTNESS OF BREATH   [DISCONTINUED] montelukast (SINGULAIR) 10 MG tablet TAKE 1 TABLET BY MOUTH AT BEDTIME (Patient taking differently: Take by mouth as needed.)   No facility-administered medications prior to visit.    Review of Systems  HENT:  Negative for hearing loss.   Eyes:  Negative for blurred vision.  Respiratory:  Negative for shortness of breath.    Cardiovascular:  Negative for chest pain.  Gastrointestinal: Negative.   Genitourinary: Negative.   Musculoskeletal:  Negative for back pain.  Neurological:  Negative for headaches.  Psychiatric/Behavioral:  Negative for depression.   All other systems reviewed and are negative.      Objective:     BP 102/76 (BP Location: Left Arm, Patient Position: Sitting, Cuff Size: Normal)   Pulse 90   Temp 98.9 F (37.2 C) (Oral)   Ht 5\' 3"  (1.6 m)   Wt 164 lb 1.6 oz (74.4 kg)   LMP 09/05/2022 (Exact Date)   SpO2 99%   BMI 29.07 kg/m    Physical Exam Vitals reviewed.  Constitutional:      Appearance: Normal appearance. She is well-groomed and normal weight.  HENT:     Right Ear: Tympanic membrane and ear canal normal.     Left Ear: Tympanic membrane and ear canal normal.     Mouth/Throat:     Mouth: Mucous membranes are moist.     Pharynx: No posterior oropharyngeal erythema.  Eyes:     Conjunctiva/sclera: Conjunctivae normal.  Neck:     Thyroid: No thyromegaly.  Cardiovascular:     Rate and Rhythm: Normal rate and regular rhythm.     Pulses: Normal pulses.     Heart sounds: S1 normal and S2 normal.  Pulmonary:     Effort: Pulmonary effort is normal.  Breath sounds: Normal breath sounds and air entry.  Abdominal:     General: Abdomen is flat. Bowel sounds are normal.     Palpations: Abdomen is soft.  Musculoskeletal:     Right lower leg: No edema.     Left lower leg: No edema.  Lymphadenopathy:     Cervical: No cervical adenopathy.  Neurological:     Mental Status: She is alert and oriented to person, place, and time. Mental status is at baseline.     Gait: Gait is intact.  Psychiatric:        Mood and Affect: Mood and affect normal.        Speech: Speech normal.        Behavior: Behavior normal.        Judgment: Judgment normal.      No results found for any visits on 09/06/22.     Assessment & Plan:    Routine Health Maintenance and Physical  Exam  Immunization History  Administered Date(s) Administered   Influenza-Unspecified 11/28/2019, 10/22/2020   PFIZER(Purple Top)SARS-COV-2 Vaccination 09/22/2019, 10/23/2019   Tdap 11/18/2015    Health Maintenance  Topic Date Due   PAP SMEAR-Modifier  09/17/2021   COVID-19 Vaccine (3 - 2023-24 season) 09/22/2022 (Originally 10/22/2021)   Hepatitis C Screening  09/06/2023 (Originally 01/28/2009)   INFLUENZA VACCINE  09/22/2022   DTaP/Tdap/Td (2 - Td or Tdap) 11/17/2025   HIV Screening  Completed   HPV VACCINES  Aged Out    Discussed health benefits of physical activity, and encouraged her to engage in regular exercise appropriate for her age and condition.  Preventative health care -     CBC with Differential/Platelet; Future -     Comprehensive metabolic panel; Future -     Lipid panel; Future Normal physical exam findings today. Handouts given on healthy eating and exercise. I counseled patient about her diet and the importance of fat- soluble vitamins and iron. Will order annual blood work today for her. RTC 1 year for annual physical or sooner if needed.She was reminded that she needs her pap smear-- pt states she will go to her GYN for this soon.   Moderate persistent asthma, unspecified whether complicated -     Budesonide-Formoterol Fumarate; Inhale 1-2 puffs by mouth twice daily as needed  Dispense: 10.2 g; Refill: 5 -     Montelukast Sodium; TAKE 1 TABLET BY MOUTH AT BEDTIME  Dispense: 90 tablet; Refill: 1    No follow-ups on file.     Karie Georges, MD

## 2022-09-14 ENCOUNTER — Other Ambulatory Visit (HOSPITAL_COMMUNITY): Payer: Self-pay

## 2022-11-02 ENCOUNTER — Other Ambulatory Visit (HOSPITAL_COMMUNITY): Payer: Self-pay

## 2022-12-09 ENCOUNTER — Telehealth: Payer: 59 | Admitting: Physician Assistant

## 2022-12-09 ENCOUNTER — Other Ambulatory Visit (HOSPITAL_COMMUNITY): Payer: Self-pay

## 2022-12-09 ENCOUNTER — Other Ambulatory Visit: Payer: Self-pay

## 2022-12-09 ENCOUNTER — Encounter (HOSPITAL_COMMUNITY): Payer: Self-pay

## 2022-12-09 DIAGNOSIS — H66001 Acute suppurative otitis media without spontaneous rupture of ear drum, right ear: Secondary | ICD-10-CM

## 2022-12-09 MED ORDER — CIPROFLOXACIN-DEXAMETHASONE 0.3-0.1 % OT SUSP
4.0000 [drp] | Freq: Two times a day (BID) | OTIC | 0 refills | Status: DC
  Filled 2022-12-09: qty 7.5, 19d supply, fill #0

## 2022-12-09 MED ORDER — AMOXICILLIN-POT CLAVULANATE 875-125 MG PO TABS: 1.0000 | ORAL_TABLET | Freq: Two times a day (BID) | ORAL | 0 refills | Status: DC

## 2022-12-09 MED ORDER — AMOXICILLIN-POT CLAVULANATE 875-125 MG PO TABS
1.0000 | ORAL_TABLET | Freq: Two times a day (BID) | ORAL | 0 refills | Status: AC
  Filled 2022-12-09: qty 20, 10d supply, fill #0

## 2022-12-09 MED ORDER — CIPROFLOXACIN-DEXAMETHASONE 0.3-0.1 % OT SUSP: 4.0000 [drp] | Freq: Two times a day (BID) | OTIC | 0 refills | Status: DC

## 2022-12-09 NOTE — Patient Instructions (Signed)
Erica Pham, thank you for joining Margaretann Loveless, PA-C for today's virtual visit.  While this provider is not your primary care provider (PCP), if your PCP is located in our provider database this encounter information will be shared with them immediately following your visit.   A [facility] MyChart account gives you access to today's visit and all your visits, tests, and labs performed at [facility] " click here if you don't have a [facility] MyChart account or go to [email or web address]  Consent: (Patient) Shirla Sailors provided verbal consent for this virtual visit at the beginning of the encounter.  Current Medications:  Current Outpatient Medications:    amoxicillin-clavulanate (AUGMENTIN) 875-125 MG tablet, Take 1 tablet by mouth 2 (two) times daily., Disp: 20 tablet, Rfl: 0   ciprofloxacin-dexamethasone (CIPRODEX) OTIC suspension, Place 4 drops into the right ear 2 (two) times daily. For 5-7 days, Disp: 7.5 mL, Rfl: 0   budesonide-formoterol (SYMBICORT) 160-4.5 MCG/ACT inhaler, Inhale 1-2 puffs into the lungs 2 (two) times daily as needed., Disp: 10.2 g, Rfl: 5   fluticasone (FLONASE) 50 MCG/ACT nasal spray, Use 2 sprays into both nostrils daily., Disp: 16 g, Rfl: 6   montelukast (SINGULAIR) 10 MG tablet, Take 1 tablet (10 mg total) by mouth at bedtime., Disp: 90 tablet, Rfl: 1   Medications ordered in this encounter:  Meds ordered this encounter  Medications   amoxicillin-clavulanate (AUGMENTIN) 875-125 MG tablet    Sig: Take 1 tablet by mouth 2 (two) times daily.    Dispense:  20 tablet    Refill:  0    Order Specific Question:   Supervising Provider    Answer:   Merrilee Jansky [id number]   ciprofloxacin-dexamethasone (CIPRODEX) OTIC suspension    Sig: Place 4 drops into the right ear 2 (two) times daily. For 5-7 days    Dispense:  7.5 mL    Refill:  0    Order Specific Question:   Supervising Provider    Answer:   Merrilee Jansky  [[id number]]     *If you need refills on other medications prior to your next appointment, please contact your pharmacy*  Follow-Up: Call back or seek an in-person evaluation if the symptoms worsen or if the condition fails to improve as anticipated.  [facility] [phone]  Other Instructions Otitis Media, Adult  Otitis media occurs when there is inflammation and fluid in the middle ear with signs and symptoms of an acute infection. The middle ear is a part of the ear that contains bones for hearing as well as air that helps send sounds to the brain. When infected fluid builds up in this space, it causes pressure and can lead to an ear infection. The eustachian tube connects the middle ear to the back of the nose (nasopharynx) and normally allows air into the middle ear. If the eustachian tube becomes blocked, fluid can build up and become infected. What are the causes? This condition is caused by a blockage in the eustachian tube. This can be caused by mucus or by swelling of the tube. Problems that can cause a blockage include: A cold or other upper respiratory infection. Allergies. An irritant, such as tobacco smoke. Enlarged adenoids. The adenoids are areas of soft tissue located high in the back of the throat, behind the nose and the roof of the mouth. They are part of the body's defense system (immune system). A mass in the nasopharynx. Damage to the ear caused by pressure  changes (barotrauma). What increases the risk? You are more likely to develop this condition if you: Smoke or are exposed to tobacco smoke. Have an opening in the roof of your mouth (cleft palate). Have gastroesophageal reflux. Have an immune system disorder. What are the signs or symptoms? Symptoms of this condition include: Ear pain. Fever. Decreased hearing. Tiredness (lethargy). Fluid leaking from the ear, if the eardrum is ruptured or has burst. Ringing in the ear. How is this  diagnosed?  This condition is diagnosed with a physical exam. During the exam, your health care provider will use an instrument called an otoscope to look in your ear and check for redness, swelling, and fluid. He or she will also ask about your symptoms. Your health care provider may also order tests, such as: A pneumatic otoscopy. This is a test to check the movement of the eardrum. It is done by squeezing a small amount of air into the ear. A tympanogram. This is a test that shows how well the eardrum moves in response to air pressure in the ear canal. It provides a graph for your health care provider to review. How is this treated? This condition can go away on its own within 3-5 days. But if the condition is caused by a bacterial infection and does not go away on its own, or if it keeps coming back, your health care provider may: Prescribe antibiotic medicine to treat the infection. Prescribe or recommend medicines to control pain. Follow these instructions at home: Take over-the-counter and prescription medicines only as told by your health care provider. If you were prescribed an antibiotic medicine, take it as told by your health care provider. Do not stop taking the antibiotic even if you start to feel better. Keep all follow-up visits. This is important. Contact a health care provider if: You have bleeding from your nose. There is a lump on your neck. You are not feeling better in 5 days. You feel worse instead of better. Get help right away if: You have severe pain that is not controlled with medicine. You have swelling, redness, or pain around your ear. You have stiffness in your neck. A part of your face is not moving (paralyzed). The bone behind your ear (mastoid bone) is tender when you touch it. You develop a severe headache. Summary Otitis media is redness, soreness, and swelling of the middle ear, usually resulting in pain and decreased hearing. This condition can go  away on its own within 3-5 days. If the problem does not go away in 3-5 days, your health care provider may give you medicines to treat the infection. If you were prescribed an antibiotic medicine, take it as told by your health care provider. Follow all instructions that were given to you by your health care provider. This information is not intended to replace advice given to you by your health care provider. Make sure you discuss any questions you have with your health care provider. Document Revised: 05/18/2020 Document Reviewed: 05/18/2020 Elsevier Patient Education  2024 Elsevier Inc.    If you have been instructed to have an in-person evaluation today at a local Urgent Care facility, please use the link below. It will take you to a list of all of our available [facility], including address, phone number and hours of operation. Please do not delay care.  [facility]  If you or a family member do not have a primary care provider, use the link below to  schedule a visit and establish care. When you choose a [facility] primary care physician or advanced practice provider, you gain a long-term partner in health. Find a Primary Care Provider  Learn more about [facility] in-office and virtual care options:  - Get Care Now

## 2022-12-09 NOTE — Progress Notes (Signed)
Virtual Visit Consent   Erica Pham, you are scheduled for a virtual visit with a  provider today. Just as with appointments in the office, your consent must be obtained to participate. Your consent will be active for this visit and any virtual visit you may have with one of our providers in the next 365 days. If you have a MyChart account, a copy of this consent can be sent to you electronically.  As this is a virtual visit, video technology does not allow for your provider to perform a traditional examination. This may limit your provider's ability to fully assess your condition. If your provider identifies any concerns that need to be evaluated in person or the need to arrange testing (such as labs, EKG, etc.), we will make arrangements to do so. Although advances in technology are sophisticated, we cannot ensure that it will always work on either your end or our end. If the connection with a video visit is poor, the visit may have to be switched to a telephone visit. With either a video or telephone visit, we are not always able to ensure that we have a secure connection.  By engaging in this virtual visit, you consent to the provision of healthcare and authorize for your insurance to be billed (if applicable) for the services provided during this visit. Depending on your insurance coverage, you may receive a charge related to this service.  I need to obtain your verbal consent now. Are you willing to proceed with your visit today? Erica Pham has provided verbal consent on 12/09/2022 for a virtual visit (video or telephone). Margaretann Loveless, PA-C  Date: 12/09/2022 8:15 AM  Virtual Visit via Video Note   I, Margaretann Loveless, connected with  Erica Pham  ([id number], [birth year]) on 12/09/22 at  8:00 AM EDT by a video-enabled telemedicine application and verified that I am speaking with the correct person using two identifiers.  Location: Patient: Virtual Visit  Location Patient: Home Provider: Virtual Visit Location Provider: Home Office   I discussed the limitations of evaluation and management by telemedicine and the availability of in person appointments. The patient expressed understanding and agreed to proceed.    History of Present Illness: Erica Pham is a 32 y.o. who identifies as a female who was assigned female at birth, and is being seen today for ear pain/pressure.  HPI: Otalgia  There is pain in the right ear. This is a new problem. The current episode started yesterday. The problem occurs constantly. The problem has been gradually worsening. There has been no fever. The pain is severe. Associated symptoms include ear discharge, headaches and rhinorrhea. Pertinent negatives include no coughing, hearing loss or sore throat. Associated symptoms comments: Hears herself echoing in her ear, popping. She has tried nothing for the symptoms. The treatment provided no relief.   Had sinus symptoms prior the week before, but that improved.   Problems:  Patient Active Problem List   Diagnosis Date Noted   Moderate asthma 08/07/2020   Seasonal allergies 08/07/2020    Allergies:  Allergies  Allergen Reactions   Prednisone Shortness Of Breath   Bee Venom Swelling   Medications:  Current Outpatient Medications:    amoxicillin-clavulanate (AUGMENTIN) 875-125 MG tablet, Take 1 tablet by mouth 2 (two) times daily., Disp: 20 tablet, Rfl: 0   ciprofloxacin-dexamethasone (CIPRODEX) OTIC suspension, Place 4 drops into the right ear 2 (two) times daily. For 5-7 days, Disp: 7.5 mL, Rfl: 0   budesonide-formoterol (SYMBICORT) 160-4.5  MCG/ACT inhaler, Inhale 1-2 puffs into the lungs 2 (two) times daily as needed., Disp: 10.2 g, Rfl: 5   fluticasone (FLONASE) 50 MCG/ACT nasal spray, Use 2 sprays into both nostrils daily., Disp: 16 g, Rfl: 6   montelukast (SINGULAIR) 10 MG tablet, Take 1 tablet (10 mg total) by mouth at bedtime., Disp: 90 tablet, Rfl:  1  Observations/Objective: Patient is well-developed, well-nourished in no acute distress.  Resting comfortably at home.  Head is normocephalic, atraumatic.  No labored breathing.  Speech is clear and coherent with logical content.  Patient is alert and oriented at baseline.    Assessment and Plan: 1. Non-recurrent acute suppurative otitis media of right ear without spontaneous rupture of tympanic membrane - amoxicillin-clavulanate (AUGMENTIN) 875-125 MG tablet; Take 1 tablet by mouth 2 (two) times daily.  Dispense: 20 tablet; Refill: 0 - ciprofloxacin-dexamethasone (CIPRODEX) OTIC suspension; Place 4 drops into the right ear 2 (two) times daily. For 5-7 days  Dispense: 7.5 mL; Refill: 0  - Worsening symptoms that have not responded to OTC medications.  - Will give Augmentin and Ciprodex - Continue saline nasal rinses - Could consider to add Flonase (Fluticasone) nasal spray over the counter for possible eustachian tube dysfunction - Steam and humidifier can help - Warm compress to ear - Stay well hydrated and get plenty of rest.  - Seek in person evaluation if no symptom improvement or if symptoms worsen   Follow Up Instructions: I discussed the assessment and treatment plan with the patient. The patient was provided an opportunity to ask questions and all were answered. The patient agreed with the plan and demonstrated an understanding of the instructions.  A copy of instructions were sent to the patient via MyChart unless otherwise noted below.    The patient was advised to call back or seek an in-person evaluation if the symptoms worsen or if the condition fails to improve as anticipated.    Margaretann Loveless, PA-C

## 2023-03-02 ENCOUNTER — Other Ambulatory Visit (HOSPITAL_COMMUNITY): Payer: Self-pay

## 2023-03-09 ENCOUNTER — Other Ambulatory Visit (HOSPITAL_COMMUNITY): Payer: Self-pay

## 2023-06-27 ENCOUNTER — Other Ambulatory Visit (HOSPITAL_COMMUNITY): Payer: Self-pay

## 2023-09-25 ENCOUNTER — Encounter: Payer: Self-pay | Admitting: Family Medicine

## 2023-09-25 ENCOUNTER — Ambulatory Visit (INDEPENDENT_AMBULATORY_CARE_PROVIDER_SITE_OTHER): Admitting: Family Medicine

## 2023-09-25 ENCOUNTER — Other Ambulatory Visit (HOSPITAL_COMMUNITY): Payer: Self-pay

## 2023-09-25 ENCOUNTER — Other Ambulatory Visit (HOSPITAL_BASED_OUTPATIENT_CLINIC_OR_DEPARTMENT_OTHER): Payer: Self-pay

## 2023-09-25 VITALS — BP 128/88 | HR 95 | Temp 98.6°F | Ht 64.0 in | Wt 163.1 lb

## 2023-09-25 DIAGNOSIS — Z0000 Encounter for general adult medical examination without abnormal findings: Secondary | ICD-10-CM | POA: Diagnosis not present

## 2023-09-25 DIAGNOSIS — Z13220 Encounter for screening for lipoid disorders: Secondary | ICD-10-CM | POA: Diagnosis not present

## 2023-09-25 DIAGNOSIS — E559 Vitamin D deficiency, unspecified: Secondary | ICD-10-CM

## 2023-09-25 DIAGNOSIS — J302 Other seasonal allergic rhinitis: Secondary | ICD-10-CM | POA: Diagnosis not present

## 2023-09-25 DIAGNOSIS — J4540 Moderate persistent asthma, uncomplicated: Secondary | ICD-10-CM | POA: Diagnosis not present

## 2023-09-25 LAB — LIPID PANEL
Cholesterol: 218 mg/dL — ABNORMAL HIGH (ref 0–200)
HDL: 77.2 mg/dL (ref >39.00)
LDL Cholesterol: 126 mg/dL — ABNORMAL HIGH (ref 0–99)
NonHDL: 141.17
Total CHOL/HDL Ratio: 3
Triglycerides: 76 mg/dL (ref 0.0–149.0)
VLDL: 15.2 mg/dL (ref 0.0–40.0)

## 2023-09-25 LAB — COMPREHENSIVE METABOLIC PANEL WITH GFR
ALT: 15 U/L (ref 0–35)
AST: 17 U/L (ref 0–37)
Albumin: 4.7 g/dL (ref 3.5–5.2)
Alkaline Phosphatase: 43 U/L (ref 39–117)
BUN: 12 mg/dL (ref 6–23)
CO2: 26 meq/L (ref 19–32)
Calcium: 9.4 mg/dL (ref 8.4–10.5)
Chloride: 102 meq/L (ref 96–112)
Creatinine, Ser: 0.66 mg/dL (ref 0.40–1.20)
GFR: 115.88 mL/min (ref >60.00)
Glucose, Bld: 83 mg/dL (ref 70–99)
Potassium: 3.8 meq/L (ref 3.5–5.1)
Sodium: 138 meq/L (ref 135–145)
Total Bilirubin: 0.4 mg/dL (ref 0.2–1.2)
Total Protein: 7.2 g/dL (ref 6.0–8.3)

## 2023-09-25 LAB — CBC WITH DIFFERENTIAL/PLATELET
Basophils Absolute: 0 K/uL (ref 0.0–0.1)
Basophils Relative: 0.6 % (ref 0.0–3.0)
Eosinophils Absolute: 0.1 K/uL (ref 0.0–0.7)
Eosinophils Relative: 1.2 % (ref 0.0–5.0)
HCT: 41.3 % (ref 36.0–46.0)
Hemoglobin: 13.5 g/dL (ref 12.0–15.0)
Lymphocytes Relative: 26 % (ref 12.0–46.0)
Lymphs Abs: 1.4 K/uL (ref 0.7–4.0)
MCHC: 32.8 g/dL (ref 30.0–36.0)
MCV: 90 fl (ref 78.0–100.0)
Monocytes Absolute: 0.5 K/uL (ref 0.1–1.0)
Monocytes Relative: 8.5 % (ref 3.0–12.0)
Neutro Abs: 3.5 K/uL (ref 1.4–7.7)
Neutrophils Relative %: 63.7 % (ref 43.0–77.0)
Platelets: 273 K/uL (ref 150.0–400.0)
RBC: 4.59 Mil/uL (ref 3.87–5.11)
RDW: 12.9 % (ref 11.5–15.5)
WBC: 5.5 K/uL (ref 4.0–10.5)

## 2023-09-25 LAB — VITAMIN D 25 HYDROXY (VIT D DEFICIENCY, FRACTURES): VITD: 19.62 ng/mL — ABNORMAL LOW (ref 30.00–100.00)

## 2023-09-25 MED ORDER — MONTELUKAST SODIUM 10 MG PO TABS
10.0000 mg | ORAL_TABLET | Freq: Every day | ORAL | 1 refills | Status: AC
  Filled 2023-09-25: qty 90, 90d supply, fill #0

## 2023-09-25 MED ORDER — BUDESONIDE-FORMOTEROL FUMARATE 160-4.5 MCG/ACT IN AERO
1.0000 | INHALATION_SPRAY | Freq: Two times a day (BID) | RESPIRATORY_TRACT | 5 refills | Status: AC | PRN
  Filled 2023-09-25 – 2023-12-11 (×2): qty 10.2, 30d supply, fill #0

## 2023-09-25 MED ORDER — FLUTICASONE PROPIONATE 50 MCG/ACT NA SUSP
2.0000 | Freq: Every day | NASAL | 6 refills | Status: AC
  Filled 2023-09-25: qty 16, 30d supply, fill #0

## 2023-09-25 NOTE — Progress Notes (Signed)
 Complete physical exam  Patient: Erica Pham   DOB: 09/13/1990   32 y.o. Female  MRN: [id number]  Subjective:    Chief Complaint  Patient presents with   Annual Exam    Erica Pham is a 33 y.o. female who presents today for a complete physical exam. She reports consuming a general diet, limits dairy due to lactose intolerance, eats fruits and veggies, good sources of protein. Home exercise routine includes walking 1-2 hrs per week. She generally feels well. She reports sleeping well, usually feels rested when she wakes up. She does not have additional problems to discuss today.   Patient does not take any vitamins or herbal supplements.   Most recent fall risk assessment:     No data to display           Most recent depression screenings:    09/25/2023    9:45 AM 09/06/2022    8:08 AM  PHQ 2/9 Scores  PHQ - 2 Score 0 0  PHQ- 9 Score 0 0    Vision:Within last year and Dental: No current dental problems and Receives regular dental care  Patient Active Problem List   Diagnosis Date Noted   Moderate asthma 08/07/2020   Seasonal allergies 08/07/2020      Patient Care Team: Ozell Heron HERO, MD as PCP - General (Family Medicine) Ob/Gyn, [facility] (Obstetrics and Gynecology)   Outpatient Medications Prior to Visit  Medication Sig   [DISCONTINUED] budesonide -formoterol  (SYMBICORT ) 160-4.5 MCG/ACT inhaler Inhale 1-2 puffs into the lungs 2 (two) times daily as needed.   [DISCONTINUED] fluticasone  (FLONASE ) 50 MCG/ACT nasal spray Use 2 sprays into both nostrils daily.   [DISCONTINUED] montelukast  (SINGULAIR ) 10 MG tablet Take 1 tablet (10 mg total) by mouth at bedtime.   [DISCONTINUED] amoxicillin -clavulanate (AUGMENTIN ) 875-125 MG tablet Take 1 tablet by mouth 2 (two) times daily.   [DISCONTINUED] ciprofloxacin -dexamethasone  (CIPRODEX ) OTIC suspension Place 4 drops into the right ear 2 (two) times daily. For 5-7 days   [DISCONTINUED]  ciprofloxacin -dexamethasone  (CIPRODEX ) OTIC suspension Place 4 drops into the right ear 2 (two) times daily, for 5-7 days.   No facility-administered medications prior to visit.    Review of Systems  HENT:  Negative for hearing loss.   Eyes:  Negative for blurred vision.  Respiratory:  Negative for shortness of breath.   Cardiovascular:  Negative for chest pain.  Gastrointestinal: Negative.   Genitourinary: Negative.   Musculoskeletal:  Negative for back pain.  Neurological:  Negative for headaches.  Psychiatric/Behavioral:  Negative for depression.        Objective:     BP 128/88   Pulse 95   Temp 98.6 F (37 C) (Oral)   Ht 5' 4 (1.626 m)   Wt 163 lb 1.6 oz (74 kg)   LMP 09/16/2023 (Exact Date)   SpO2 98%   BMI 28.00 kg/m    Physical Exam Vitals reviewed.  Constitutional:      Appearance: Normal appearance. She is well-groomed and normal weight.  HENT:     Right Ear: Tympanic membrane and ear canal normal.     Left Ear: Tympanic membrane and ear canal normal.     Mouth/Throat:     Mouth: Mucous membranes are moist.     Pharynx: No posterior oropharyngeal erythema.  Eyes:     Conjunctiva/sclera: Conjunctivae normal.  Neck:     Thyroid: No thyromegaly.  Cardiovascular:     Rate and Rhythm: Normal rate and regular rhythm.  Pulses: Normal pulses.     Heart sounds: S1 normal and S2 normal.  Pulmonary:     Effort: Pulmonary effort is normal.     Breath sounds: Normal breath sounds and air entry.  Abdominal:     General: Abdomen is flat. Bowel sounds are normal.     Palpations: Abdomen is soft.  Musculoskeletal:     Right lower leg: No edema.     Left lower leg: No edema.  Lymphadenopathy:     Cervical: No cervical adenopathy.  Neurological:     Mental Status: She is alert and oriented to person, place, and time. Mental status is at baseline.     Gait: Gait is intact.  Psychiatric:        Mood and Affect: Mood and affect normal.        Speech: Speech  normal.        Behavior: Behavior normal.        Judgment: Judgment normal.      No results found for any visits on 09/25/23.     Assessment & Plan:    Routine Health Maintenance and Physical Exam  Immunization History  Administered Date(s) Administered   Influenza-Unspecified 11/28/2019, 10/22/2020   PFIZER(Purple Top)SARS-COV-2 Vaccination 09/22/2019, 10/23/2019   Tdap 11/18/2015    Health Maintenance  Topic Date Due   Pneumococcal Vaccine: 19-49 Years (1 of 2 - PCV) Never done   Hepatitis B Vaccines (1 of 3 - 19+ 3-dose series) Never done   HPV VACCINES (1 - 3-dose SCDM series) Never done   Cervical Cancer Screening (HPV/Pap Cotest)  09/17/2021   COVID-19 Vaccine (3 - 2024-25 season) 10/23/2022   INFLUENZA VACCINE  09/22/2023   Hepatitis C Screening  09/24/2024 (Originally 01/28/2009)   DTaP/Tdap/Td (2 - Td or Tdap) 11/17/2025   HIV Screening  Completed   Meningococcal B Vaccine  Aged Out    Discussed health benefits of physical activity, and encouraged her to engage in regular exercise appropriate for her age and condition.  Lipid screening -     Lipid panel; Future  Moderate persistent asthma, unspecified whether complicated -     Budesonide -Formoterol  Fumarate; Inhale 1-2 puffs into the lungs 2 (two) times daily as needed.  Dispense: 10.2 g; Refill: 5 -     Fluticasone  Propionate; Use 2 sprays into both nostrils daily.  Dispense: 16 g; Refill: 6 -     Montelukast  Sodium; Take 1 tablet (10 mg total) by mouth at bedtime.  Dispense: 90 tablet; Refill: 1  Seasonal allergies -     Fluticasone  Propionate; Use 2 sprays into both nostrils daily.  Dispense: 16 g; Refill: 6  Routine general medical examination at a health care facility -     CBC with Differential/Platelet; Future -     Comprehensive metabolic panel with GFR; Future  Vitamin D  deficiency -     VITAMIN D  25 Hydroxy (Vit-D Deficiency, Fractures); Future   Normal physical exam findings. I counseled the  patient on the recommended amount of exercise per CDC recommendation. I reviewed preventative screening, immunizations, and medical history and updated in the chart, and appropriate labs and vaccinations were ordered. Handouts given on healthy eating and exercise.   Return in 1 year (on 09/24/2024) for annual physical exam.     Heron CHRISTELLA Sharper, MD

## 2023-09-25 NOTE — Patient Instructions (Signed)

## 2023-09-27 ENCOUNTER — Ambulatory Visit: Payer: Self-pay | Admitting: Family Medicine

## 2023-10-05 ENCOUNTER — Other Ambulatory Visit (HOSPITAL_COMMUNITY): Payer: Self-pay

## 2023-12-11 ENCOUNTER — Other Ambulatory Visit (HOSPITAL_COMMUNITY): Payer: Self-pay
